# Patient Record
Sex: Male | Born: 1960
Health system: Southern US, Community
[De-identification: ages and names within clinical notes are randomized; demographics above are authoritative.]

## PROBLEM LIST (undated history)

## (undated) DIAGNOSIS — Z8719 Personal history of other diseases of the digestive system: Secondary | ICD-10-CM

## (undated) DIAGNOSIS — Z21 Asymptomatic human immunodeficiency virus [HIV] infection status: Secondary | ICD-10-CM

## (undated) DIAGNOSIS — J449 Chronic obstructive pulmonary disease, unspecified: Secondary | ICD-10-CM

## (undated) DIAGNOSIS — B20 Human immunodeficiency virus [HIV] disease: Secondary | ICD-10-CM

## (undated) HISTORY — DX: Asymptomatic human immunodeficiency virus (hiv) infection status: Z21

## (undated) HISTORY — DX: Human immunodeficiency virus (HIV) disease: B20

---

## 2001-02-04 ENCOUNTER — Emergency Department (HOSPITAL_COMMUNITY): Admission: EM | Admit: 2001-02-04 | Discharge: 2001-02-04 | Payer: Self-pay | Admitting: Emergency Medicine

## 2003-12-22 ENCOUNTER — Inpatient Hospital Stay (HOSPITAL_COMMUNITY): Admission: RE | Admit: 2003-12-22 | Discharge: 2003-12-25 | Payer: Self-pay | Admitting: Psychiatry

## 2004-03-23 ENCOUNTER — Emergency Department (HOSPITAL_COMMUNITY): Admission: EM | Admit: 2004-03-23 | Discharge: 2004-03-23 | Payer: Self-pay | Admitting: Emergency Medicine

## 2004-09-28 ENCOUNTER — Emergency Department (HOSPITAL_COMMUNITY): Admission: EM | Admit: 2004-09-28 | Discharge: 2004-09-28 | Payer: Self-pay | Admitting: Emergency Medicine

## 2005-05-23 ENCOUNTER — Emergency Department (HOSPITAL_COMMUNITY): Admission: EM | Admit: 2005-05-23 | Discharge: 2005-05-23 | Payer: Self-pay | Admitting: Emergency Medicine

## 2005-07-11 ENCOUNTER — Emergency Department (HOSPITAL_COMMUNITY): Admission: EM | Admit: 2005-07-11 | Discharge: 2005-07-11 | Payer: Self-pay | Admitting: Emergency Medicine

## 2006-09-12 ENCOUNTER — Encounter: Admission: RE | Admit: 2006-09-12 | Discharge: 2006-09-12 | Payer: Self-pay | Admitting: Internal Medicine

## 2006-09-12 ENCOUNTER — Ambulatory Visit: Payer: Self-pay | Admitting: Internal Medicine

## 2006-09-12 ENCOUNTER — Encounter (INDEPENDENT_AMBULATORY_CARE_PROVIDER_SITE_OTHER): Payer: Self-pay | Admitting: *Deleted

## 2006-09-12 LAB — CONVERTED CEMR LAB: CD4 T Cell Abs: 500

## 2006-09-20 ENCOUNTER — Encounter: Payer: Self-pay | Admitting: Internal Medicine

## 2006-09-20 DIAGNOSIS — K449 Diaphragmatic hernia without obstruction or gangrene: Secondary | ICD-10-CM | POA: Insufficient documentation

## 2006-09-20 DIAGNOSIS — G43909 Migraine, unspecified, not intractable, without status migrainosus: Secondary | ICD-10-CM | POA: Insufficient documentation

## 2006-09-20 DIAGNOSIS — R519 Headache, unspecified: Secondary | ICD-10-CM | POA: Insufficient documentation

## 2006-09-20 DIAGNOSIS — F329 Major depressive disorder, single episode, unspecified: Secondary | ICD-10-CM

## 2006-09-20 DIAGNOSIS — G4700 Insomnia, unspecified: Secondary | ICD-10-CM

## 2006-09-20 DIAGNOSIS — B59 Pneumocystosis: Secondary | ICD-10-CM

## 2006-09-20 DIAGNOSIS — B029 Zoster without complications: Secondary | ICD-10-CM | POA: Insufficient documentation

## 2006-09-20 DIAGNOSIS — N529 Male erectile dysfunction, unspecified: Secondary | ICD-10-CM

## 2006-09-20 DIAGNOSIS — K2980 Duodenitis without bleeding: Secondary | ICD-10-CM | POA: Insufficient documentation

## 2006-09-20 DIAGNOSIS — IMO0002 Reserved for concepts with insufficient information to code with codable children: Secondary | ICD-10-CM | POA: Insufficient documentation

## 2006-09-20 DIAGNOSIS — B20 Human immunodeficiency virus [HIV] disease: Secondary | ICD-10-CM

## 2006-09-20 DIAGNOSIS — R51 Headache: Secondary | ICD-10-CM

## 2006-09-20 DIAGNOSIS — F17200 Nicotine dependence, unspecified, uncomplicated: Secondary | ICD-10-CM | POA: Insufficient documentation

## 2006-09-20 DIAGNOSIS — A539 Syphilis, unspecified: Secondary | ICD-10-CM | POA: Insufficient documentation

## 2006-09-20 DIAGNOSIS — R197 Diarrhea, unspecified: Secondary | ICD-10-CM

## 2006-09-20 DIAGNOSIS — G609 Hereditary and idiopathic neuropathy, unspecified: Secondary | ICD-10-CM | POA: Insufficient documentation

## 2006-09-20 LAB — CONVERTED CEMR LAB
HCV Ab: NEGATIVE
Hep B S Ab: UNDETERMINED
Hepatitis B Surface Ag: NEGATIVE
RPR Titer: 1

## 2006-09-25 ENCOUNTER — Ambulatory Visit: Payer: Self-pay | Admitting: Internal Medicine

## 2006-09-25 DIAGNOSIS — F528 Other sexual dysfunction not due to a substance or known physiological condition: Secondary | ICD-10-CM

## 2006-10-01 ENCOUNTER — Encounter (INDEPENDENT_AMBULATORY_CARE_PROVIDER_SITE_OTHER): Payer: Self-pay | Admitting: *Deleted

## 2006-10-08 ENCOUNTER — Encounter (INDEPENDENT_AMBULATORY_CARE_PROVIDER_SITE_OTHER): Payer: Self-pay | Admitting: *Deleted

## 2006-10-16 ENCOUNTER — Telehealth: Payer: Self-pay | Admitting: Internal Medicine

## 2006-11-14 ENCOUNTER — Telehealth: Payer: Self-pay | Admitting: Internal Medicine

## 2006-11-28 ENCOUNTER — Encounter: Payer: Self-pay | Admitting: Internal Medicine

## 2006-11-29 ENCOUNTER — Ambulatory Visit: Payer: Self-pay | Admitting: Internal Medicine

## 2006-11-29 ENCOUNTER — Encounter: Admission: RE | Admit: 2006-11-29 | Discharge: 2006-11-29 | Payer: Self-pay | Admitting: Internal Medicine

## 2006-12-13 ENCOUNTER — Ambulatory Visit: Payer: Self-pay | Admitting: Internal Medicine

## 2007-02-18 ENCOUNTER — Encounter (INDEPENDENT_AMBULATORY_CARE_PROVIDER_SITE_OTHER): Payer: Self-pay | Admitting: *Deleted

## 2007-02-18 ENCOUNTER — Telehealth: Payer: Self-pay | Admitting: Internal Medicine

## 2007-02-25 ENCOUNTER — Encounter: Payer: Self-pay | Admitting: Internal Medicine

## 2007-02-25 ENCOUNTER — Ambulatory Visit: Payer: Self-pay | Admitting: Infectious Diseases

## 2007-02-25 ENCOUNTER — Encounter: Admission: RE | Admit: 2007-02-25 | Discharge: 2007-02-25 | Payer: Self-pay | Admitting: Infectious Diseases

## 2007-02-25 LAB — CONVERTED CEMR LAB
ALT: 23 units/L (ref 0–53)
AST: 37 units/L (ref 0–37)
Albumin: 4.3 g/dL (ref 3.5–5.2)
Alkaline Phosphatase: 75 units/L (ref 39–117)
BUN: 10 mg/dL (ref 6–23)
Basophils Absolute: 0 10*3/uL (ref 0.0–0.1)
Basophils Relative: 0 % (ref 0–1)
CO2: 27 meq/L (ref 19–32)
Calcium: 9.1 mg/dL (ref 8.4–10.5)
Chloride: 103 meq/L (ref 96–112)
Creatinine, Ser: 1.09 mg/dL (ref 0.40–1.50)
Eosinophils Absolute: 0.1 10*3/uL (ref 0.0–0.7)
Eosinophils Relative: 1 % (ref 0–5)
Glucose, Bld: 102 mg/dL — ABNORMAL HIGH (ref 70–99)
HCT: 43 % (ref 39.0–52.0)
HIV 1 RNA Quant: 150 copies/mL — ABNORMAL HIGH (ref <50)
HIV-1 RNA Quant, Log: 2.18 — ABNORMAL HIGH (ref <1.70)
Hemoglobin: 14.7 g/dL (ref 13.0–17.0)
Lymphocytes Relative: 26 % (ref 12–46)
Lymphs Abs: 2.1 10*3/uL (ref 0.7–3.3)
MCHC: 34.2 g/dL (ref 30.0–36.0)
MCV: 100.2 fL — ABNORMAL HIGH (ref 78.0–100.0)
Monocytes Absolute: 0.8 10*3/uL — ABNORMAL HIGH (ref 0.2–0.7)
Monocytes Relative: 10 % (ref 3–11)
Neutro Abs: 5.1 10*3/uL (ref 1.7–7.7)
Neutrophils Relative %: 63 % (ref 43–77)
Platelets: 255 10*3/uL (ref 150–400)
Potassium: 3.7 meq/L (ref 3.5–5.3)
RBC: 4.29 M/uL (ref 4.22–5.81)
RDW: 15.1 % — ABNORMAL HIGH (ref 11.5–14.0)
Sodium: 141 meq/L (ref 135–145)
Total Bilirubin: 0.7 mg/dL (ref 0.3–1.2)
Total Protein: 7.2 g/dL (ref 6.0–8.3)
WBC: 8.2 10*3/uL (ref 4.0–10.5)

## 2007-03-07 ENCOUNTER — Encounter (INDEPENDENT_AMBULATORY_CARE_PROVIDER_SITE_OTHER): Payer: Self-pay | Admitting: *Deleted

## 2007-03-12 ENCOUNTER — Ambulatory Visit: Payer: Self-pay | Admitting: Internal Medicine

## 2007-03-12 DIAGNOSIS — R350 Frequency of micturition: Secondary | ICD-10-CM

## 2007-03-12 LAB — CONVERTED CEMR LAB
Glucose, Urine, Semiquant: NEGATIVE
Ketones, urine, test strip: NEGATIVE
Nitrite: NEGATIVE
PSA: 0.94 ng/mL (ref 0.10–4.00)
Protein, U semiquant: 30
Specific Gravity, Urine: 1.025
Testosterone: 599.5 ng/dL (ref 350–890)
Urobilinogen, UA: 1
WBC Urine, dipstick: NEGATIVE
pH: 6

## 2007-03-13 ENCOUNTER — Encounter: Payer: Self-pay | Admitting: Internal Medicine

## 2007-03-14 ENCOUNTER — Telehealth: Payer: Self-pay | Admitting: Internal Medicine

## 2007-03-19 ENCOUNTER — Telehealth: Payer: Self-pay | Admitting: Internal Medicine

## 2007-04-01 ENCOUNTER — Telehealth: Payer: Self-pay | Admitting: Internal Medicine

## 2007-04-14 ENCOUNTER — Emergency Department (HOSPITAL_COMMUNITY): Admission: EM | Admit: 2007-04-14 | Discharge: 2007-04-14 | Payer: Self-pay | Admitting: Emergency Medicine

## 2007-05-19 ENCOUNTER — Encounter: Payer: Self-pay | Admitting: Internal Medicine

## 2007-05-19 ENCOUNTER — Emergency Department (HOSPITAL_COMMUNITY): Admission: EM | Admit: 2007-05-19 | Discharge: 2007-05-19 | Payer: Self-pay | Admitting: Emergency Medicine

## 2007-06-10 ENCOUNTER — Encounter: Admission: RE | Admit: 2007-06-10 | Discharge: 2007-06-10 | Payer: Self-pay | Admitting: Internal Medicine

## 2007-06-10 ENCOUNTER — Ambulatory Visit: Payer: Self-pay | Admitting: Internal Medicine

## 2007-06-10 LAB — CONVERTED CEMR LAB
ALT: 11 units/L (ref 0–53)
AST: 15 units/L (ref 0–37)
Albumin: 4.5 g/dL (ref 3.5–5.2)
Alkaline Phosphatase: 97 units/L (ref 39–117)
BUN: 13 mg/dL (ref 6–23)
Basophils Absolute: 0 10*3/uL (ref 0.0–0.1)
Basophils Relative: 0 % (ref 0–1)
CO2: 24 meq/L (ref 19–32)
Calcium: 9.5 mg/dL (ref 8.4–10.5)
Chloride: 108 meq/L (ref 96–112)
Creatinine, Ser: 1.08 mg/dL (ref 0.40–1.50)
Eosinophils Absolute: 0.1 10*3/uL — ABNORMAL LOW (ref 0.2–0.7)
Eosinophils Relative: 2 % (ref 0–5)
Glucose, Bld: 94 mg/dL (ref 70–99)
HCT: 43.7 % (ref 39.0–52.0)
HIV 1 RNA Quant: 93 copies/mL — ABNORMAL HIGH (ref <50)
HIV-1 RNA Quant, Log: 1.97 — ABNORMAL HIGH (ref <1.70)
Hemoglobin: 14.2 g/dL (ref 13.0–17.0)
Lymphocytes Relative: 26 % (ref 12–46)
Lymphs Abs: 1.8 10*3/uL (ref 0.7–4.0)
MCHC: 32.5 g/dL (ref 30.0–36.0)
MCV: 103.8 fL — ABNORMAL HIGH (ref 78.0–100.0)
Monocytes Absolute: 0.5 10*3/uL (ref 0.1–1.0)
Monocytes Relative: 8 % (ref 3–12)
Neutro Abs: 4.5 10*3/uL (ref 1.7–7.7)
Neutrophils Relative %: 65 % (ref 43–77)
Platelets: 249 10*3/uL (ref 150–400)
Potassium: 4.5 meq/L (ref 3.5–5.3)
RBC: 4.21 M/uL — ABNORMAL LOW (ref 4.22–5.81)
RDW: 14.7 % (ref 11.5–15.5)
Sodium: 143 meq/L (ref 135–145)
Total Bilirubin: 0.8 mg/dL (ref 0.3–1.2)
Total Protein: 7.6 g/dL (ref 6.0–8.3)
WBC: 7 10*3/uL (ref 4.0–10.5)

## 2007-06-25 ENCOUNTER — Ambulatory Visit: Payer: Self-pay | Admitting: Internal Medicine

## 2007-06-25 DIAGNOSIS — J984 Other disorders of lung: Secondary | ICD-10-CM

## 2007-07-15 ENCOUNTER — Encounter: Payer: Self-pay | Admitting: Internal Medicine

## 2007-08-07 ENCOUNTER — Telehealth: Payer: Self-pay | Admitting: Internal Medicine

## 2007-09-25 ENCOUNTER — Encounter: Admission: RE | Admit: 2007-09-25 | Discharge: 2007-09-25 | Payer: Self-pay | Admitting: Internal Medicine

## 2007-09-25 ENCOUNTER — Ambulatory Visit: Payer: Self-pay | Admitting: Internal Medicine

## 2007-09-25 LAB — CONVERTED CEMR LAB
ALT: 12 units/L (ref 0–53)
AST: 18 units/L (ref 0–37)
Albumin: 4.2 g/dL (ref 3.5–5.2)
Alkaline Phosphatase: 91 units/L (ref 39–117)
BUN: 12 mg/dL (ref 6–23)
Basophils Absolute: 0 10*3/uL (ref 0.0–0.1)
Basophils Relative: 0 % (ref 0–1)
CO2: 21 meq/L (ref 19–32)
Calcium: 8.8 mg/dL (ref 8.4–10.5)
Chloride: 108 meq/L (ref 96–112)
Creatinine, Ser: 0.85 mg/dL (ref 0.40–1.50)
Eosinophils Absolute: 0.1 10*3/uL (ref 0.0–0.7)
Eosinophils Relative: 2 % (ref 0–5)
Glucose, Bld: 99 mg/dL (ref 70–99)
HCT: 40.4 % (ref 39.0–52.0)
HIV 1 RNA Quant: <50 copies/mL (ref <50)
HIV-1 RNA Quant, Log: <1.7 (ref <1.70)
Hemoglobin: 13.6 g/dL (ref 13.0–17.0)
Lymphocytes Relative: 29 % (ref 12–46)
Lymphs Abs: 2.1 10*3/uL (ref 0.7–4.0)
MCHC: 33.7 g/dL (ref 30.0–36.0)
MCV: 100 fL (ref 78.0–100.0)
Monocytes Absolute: 0.6 10*3/uL (ref 0.1–1.0)
Monocytes Relative: 8 % (ref 3–12)
Neutro Abs: 4.5 10*3/uL (ref 1.7–7.7)
Neutrophils Relative %: 62 % (ref 43–77)
Platelets: 217 10*3/uL (ref 150–400)
Potassium: 4.2 meq/L (ref 3.5–5.3)
RBC: 4.04 M/uL — ABNORMAL LOW (ref 4.22–5.81)
RDW: 14.6 % (ref 11.5–15.5)
Sodium: 142 meq/L (ref 135–145)
Total Bilirubin: 0.5 mg/dL (ref 0.3–1.2)
Total Protein: 7.1 g/dL (ref 6.0–8.3)
WBC: 7.3 10*3/uL (ref 4.0–10.5)

## 2007-10-08 ENCOUNTER — Ambulatory Visit: Payer: Self-pay | Admitting: Internal Medicine

## 2007-10-27 ENCOUNTER — Emergency Department (HOSPITAL_COMMUNITY): Admission: EM | Admit: 2007-10-27 | Discharge: 2007-10-27 | Payer: Self-pay | Admitting: Emergency Medicine

## 2007-11-11 ENCOUNTER — Encounter (INDEPENDENT_AMBULATORY_CARE_PROVIDER_SITE_OTHER): Payer: Self-pay | Admitting: *Deleted

## 2008-01-01 ENCOUNTER — Encounter: Admission: RE | Admit: 2008-01-01 | Discharge: 2008-01-01 | Payer: Self-pay | Admitting: Internal Medicine

## 2008-01-01 ENCOUNTER — Ambulatory Visit: Payer: Self-pay | Admitting: Internal Medicine

## 2008-01-01 ENCOUNTER — Telehealth: Payer: Self-pay | Admitting: Internal Medicine

## 2008-01-01 LAB — CONVERTED CEMR LAB
ALT: 31 units/L (ref 0–53)
AST: 54 units/L — ABNORMAL HIGH (ref 0–37)
Albumin: 4.3 g/dL (ref 3.5–5.2)
Alkaline Phosphatase: 81 units/L (ref 39–117)
BUN: 10 mg/dL (ref 6–23)
Basophils Absolute: 0 10*3/uL (ref 0.0–0.1)
Basophils Relative: 0 % (ref 0–1)
CO2: 26 meq/L (ref 19–32)
Calcium: 9.2 mg/dL (ref 8.4–10.5)
Chloride: 104 meq/L (ref 96–112)
Creatinine, Ser: 0.85 mg/dL (ref 0.40–1.50)
Eosinophils Absolute: 0.1 10*3/uL (ref 0.0–0.7)
Eosinophils Relative: 1 % (ref 0–5)
Glucose, Bld: 97 mg/dL (ref 70–99)
HCT: 40.4 % (ref 39.0–52.0)
HIV 1 RNA Quant: 117 copies/mL — ABNORMAL HIGH (ref <50)
HIV-1 RNA Quant, Log: 2.07 — ABNORMAL HIGH (ref <1.70)
Hemoglobin: 13.7 g/dL (ref 13.0–17.0)
Lymphocytes Relative: 28 % (ref 12–46)
Lymphs Abs: 2.3 10*3/uL (ref 0.7–4.0)
MCHC: 33.9 g/dL (ref 30.0–36.0)
MCV: 99 fL (ref 78.0–100.0)
Monocytes Absolute: 0.6 10*3/uL (ref 0.1–1.0)
Monocytes Relative: 8 % (ref 3–12)
Neutro Abs: 5.1 10*3/uL (ref 1.7–7.7)
Neutrophils Relative %: 63 % (ref 43–77)
Platelets: 252 10*3/uL (ref 150–400)
Potassium: 3.5 meq/L (ref 3.5–5.3)
RBC: 4.08 M/uL — ABNORMAL LOW (ref 4.22–5.81)
RDW: 14.5 % (ref 11.5–15.5)
Sodium: 142 meq/L (ref 135–145)
Total Bilirubin: 0.9 mg/dL (ref 0.3–1.2)
Total Protein: 7.3 g/dL (ref 6.0–8.3)
WBC: 8 10*3/uL (ref 4.0–10.5)

## 2008-01-14 ENCOUNTER — Ambulatory Visit: Payer: Self-pay | Admitting: Internal Medicine

## 2008-01-28 ENCOUNTER — Encounter: Payer: Self-pay | Admitting: Internal Medicine

## 2008-02-13 ENCOUNTER — Telehealth: Payer: Self-pay | Admitting: Internal Medicine

## 2008-04-15 ENCOUNTER — Ambulatory Visit: Payer: Self-pay | Admitting: Internal Medicine

## 2008-04-15 LAB — CONVERTED CEMR LAB
ALT: 15 units/L (ref 0–53)
AST: 19 units/L (ref 0–37)
Albumin: 4.5 g/dL (ref 3.5–5.2)
Alkaline Phosphatase: 93 units/L (ref 39–117)
BUN: 12 mg/dL (ref 6–23)
Basophils Absolute: 0 10*3/uL (ref 0.0–0.1)
Basophils Relative: 0 % (ref 0–1)
CO2: 22 meq/L (ref 19–32)
Calcium: 9.7 mg/dL (ref 8.4–10.5)
Chloride: 105 meq/L (ref 96–112)
Creatinine, Ser: 0.96 mg/dL (ref 0.40–1.50)
Eosinophils Absolute: 0.1 10*3/uL (ref 0.0–0.7)
Eosinophils Relative: 1 % (ref 0–5)
Glucose, Bld: 98 mg/dL (ref 70–99)
HCT: 43 % (ref 39.0–52.0)
HIV 1 RNA Quant: 270 copies/mL — ABNORMAL HIGH (ref <50)
HIV-1 RNA Quant, Log: 2.43 — ABNORMAL HIGH (ref <1.70)
Hemoglobin: 14.4 g/dL (ref 13.0–17.0)
Lymphocytes Relative: 27 % (ref 12–46)
Lymphs Abs: 2.1 10*3/uL (ref 0.7–4.0)
MCHC: 33.5 g/dL (ref 30.0–36.0)
MCV: 100.9 fL — ABNORMAL HIGH (ref 78.0–100.0)
Monocytes Absolute: 0.6 10*3/uL (ref 0.1–1.0)
Monocytes Relative: 8 % (ref 3–12)
Neutro Abs: 4.8 10*3/uL (ref 1.7–7.7)
Neutrophils Relative %: 63 % (ref 43–77)
Platelets: 251 10*3/uL (ref 150–400)
Potassium: 4.2 meq/L (ref 3.5–5.3)
RBC: 4.26 M/uL (ref 4.22–5.81)
RDW: 14.6 % (ref 11.5–15.5)
Sodium: 140 meq/L (ref 135–145)
Total Bilirubin: 0.6 mg/dL (ref 0.3–1.2)
Total Protein: 7.7 g/dL (ref 6.0–8.3)
WBC: 7.6 10*3/uL (ref 4.0–10.5)

## 2008-04-19 ENCOUNTER — Encounter: Payer: Self-pay | Admitting: Internal Medicine

## 2008-04-30 ENCOUNTER — Ambulatory Visit: Payer: Self-pay | Admitting: Internal Medicine

## 2008-04-30 ENCOUNTER — Ambulatory Visit (HOSPITAL_COMMUNITY): Admission: RE | Admit: 2008-04-30 | Discharge: 2008-04-30 | Payer: Self-pay | Admitting: Internal Medicine

## 2008-05-19 ENCOUNTER — Encounter: Payer: Self-pay | Admitting: Internal Medicine

## 2008-05-20 ENCOUNTER — Encounter: Payer: Self-pay | Admitting: Internal Medicine

## 2008-05-20 ENCOUNTER — Encounter (INDEPENDENT_AMBULATORY_CARE_PROVIDER_SITE_OTHER): Payer: Self-pay | Admitting: *Deleted

## 2008-07-20 ENCOUNTER — Ambulatory Visit: Payer: Self-pay | Admitting: Internal Medicine

## 2008-07-20 LAB — CONVERTED CEMR LAB
ALT: 15 units/L (ref 0–53)
AST: 20 units/L (ref 0–37)
Albumin: 4.1 g/dL (ref 3.5–5.2)
Alkaline Phosphatase: 81 units/L (ref 39–117)
BUN: 10 mg/dL (ref 6–23)
Basophils Absolute: 0 10*3/uL (ref 0.0–0.1)
Basophils Relative: 1 % (ref 0–1)
CO2: 25 meq/L (ref 19–32)
Calcium: 8.9 mg/dL (ref 8.4–10.5)
Chloride: 105 meq/L (ref 96–112)
Creatinine, Ser: 0.99 mg/dL (ref 0.40–1.50)
Eosinophils Absolute: 0.1 10*3/uL (ref 0.0–0.7)
Eosinophils Relative: 1 % (ref 0–5)
Glucose, Bld: 95 mg/dL (ref 70–99)
HCT: 44.4 % (ref 39.0–52.0)
HIV 1 RNA Quant: 129 copies/mL — ABNORMAL HIGH (ref <48)
HIV-1 RNA Quant, Log: 2.11 — ABNORMAL HIGH (ref <1.68)
Hemoglobin: 14.8 g/dL (ref 13.0–17.0)
Lymphocytes Relative: 27 % (ref 12–46)
Lymphs Abs: 2.2 10*3/uL (ref 0.7–4.0)
MCHC: 33.3 g/dL (ref 30.0–36.0)
MCV: 100.7 fL — ABNORMAL HIGH (ref 78.0–100.0)
Monocytes Absolute: 0.6 10*3/uL (ref 0.1–1.0)
Monocytes Relative: 8 % (ref 3–12)
Neutro Abs: 5.1 10*3/uL (ref 1.7–7.7)
Neutrophils Relative %: 64 % (ref 43–77)
Platelets: 269 10*3/uL (ref 150–400)
Potassium: 4.1 meq/L (ref 3.5–5.3)
RBC: 4.41 M/uL (ref 4.22–5.81)
RDW: 14.8 % (ref 11.5–15.5)
Sodium: 143 meq/L (ref 135–145)
Total Bilirubin: 0.7 mg/dL (ref 0.3–1.2)
Total Protein: 7.5 g/dL (ref 6.0–8.3)
WBC: 8 10*3/uL (ref 4.0–10.5)

## 2008-07-27 ENCOUNTER — Emergency Department (HOSPITAL_COMMUNITY): Admission: EM | Admit: 2008-07-27 | Discharge: 2008-07-27 | Payer: Self-pay | Admitting: Family Medicine

## 2008-08-06 ENCOUNTER — Ambulatory Visit: Payer: Self-pay | Admitting: Internal Medicine

## 2008-08-20 ENCOUNTER — Ambulatory Visit: Payer: Self-pay | Admitting: Internal Medicine

## 2008-08-20 DIAGNOSIS — L299 Pruritus, unspecified: Secondary | ICD-10-CM | POA: Insufficient documentation

## 2008-08-27 ENCOUNTER — Telehealth (INDEPENDENT_AMBULATORY_CARE_PROVIDER_SITE_OTHER): Payer: Self-pay | Admitting: *Deleted

## 2008-09-01 ENCOUNTER — Ambulatory Visit: Payer: Self-pay | Admitting: Internal Medicine

## 2008-09-01 DIAGNOSIS — B370 Candidal stomatitis: Secondary | ICD-10-CM | POA: Insufficient documentation

## 2008-10-15 ENCOUNTER — Encounter: Payer: Self-pay | Admitting: Internal Medicine

## 2008-11-04 ENCOUNTER — Ambulatory Visit: Payer: Self-pay | Admitting: Internal Medicine

## 2008-11-04 LAB — CONVERTED CEMR LAB
ALT: 17 units/L (ref 0–53)
AST: 19 units/L (ref 0–37)
Albumin: 3.9 g/dL (ref 3.5–5.2)
Alkaline Phosphatase: 81 units/L (ref 39–117)
BUN: 10 mg/dL (ref 6–23)
Basophils Absolute: 0 10*3/uL (ref 0.0–0.1)
Basophils Relative: 0 % (ref 0–1)
CO2: 25 meq/L (ref 19–32)
Calcium: 8.8 mg/dL (ref 8.4–10.5)
Chloride: 107 meq/L (ref 96–112)
Cholesterol: 118 mg/dL (ref 0–200)
Creatinine, Ser: 0.95 mg/dL (ref 0.40–1.50)
Eosinophils Absolute: 0.1 10*3/uL (ref 0.0–0.7)
Eosinophils Relative: 2 % (ref 0–5)
GFR calc Af Amer: >60 mL/min (ref >60)
GFR calc non Af Amer: >60 mL/min (ref >60)
Glucose, Bld: 85 mg/dL (ref 70–99)
HCT: 38.8 % — ABNORMAL LOW (ref 39.0–52.0)
HDL: 37 mg/dL — ABNORMAL LOW (ref >39)
HIV 1 RNA Quant: <48 copies/mL (ref <48)
HIV-1 RNA Quant, Log: <1.68 (ref <1.68)
Hemoglobin: 13.3 g/dL (ref 13.0–17.0)
LDL Cholesterol: 68 mg/dL (ref 0–99)
Lymphocytes Relative: 28 % (ref 12–46)
Lymphs Abs: 2.1 10*3/uL (ref 0.7–4.0)
MCHC: 34.3 g/dL (ref 30.0–36.0)
MCV: 100 fL (ref 78.0–100.0)
Monocytes Absolute: 0.8 10*3/uL (ref 0.1–1.0)
Monocytes Relative: 10 % (ref 3–12)
Neutro Abs: 4.3 10*3/uL (ref 1.7–7.7)
Neutrophils Relative %: 59 % (ref 43–77)
Platelets: 248 10*3/uL (ref 150–400)
Potassium: 3.9 meq/L (ref 3.5–5.3)
RBC: 3.88 M/uL — ABNORMAL LOW (ref 4.22–5.81)
RDW: 14.6 % (ref 11.5–15.5)
Sodium: 141 meq/L (ref 135–145)
Total Bilirubin: 0.4 mg/dL (ref 0.3–1.2)
Total CHOL/HDL Ratio: 3.2
Total Protein: 7 g/dL (ref 6.0–8.3)
Triglycerides: 66 mg/dL (ref <150)
VLDL: 13 mg/dL (ref 0–40)
WBC: 7.3 10*3/uL (ref 4.0–10.5)

## 2008-11-19 ENCOUNTER — Ambulatory Visit: Payer: Self-pay | Admitting: Internal Medicine

## 2008-11-24 ENCOUNTER — Encounter (INDEPENDENT_AMBULATORY_CARE_PROVIDER_SITE_OTHER): Payer: Self-pay | Admitting: *Deleted

## 2008-12-06 ENCOUNTER — Encounter (INDEPENDENT_AMBULATORY_CARE_PROVIDER_SITE_OTHER): Payer: Self-pay | Admitting: *Deleted

## 2009-02-18 ENCOUNTER — Ambulatory Visit: Payer: Self-pay | Admitting: Internal Medicine

## 2009-02-18 LAB — CONVERTED CEMR LAB
ALT: 10 units/L (ref 0–53)
AST: 15 units/L (ref 0–37)
Albumin: 4.3 g/dL (ref 3.5–5.2)
Alkaline Phosphatase: 84 units/L (ref 39–117)
BUN: 13 mg/dL (ref 6–23)
Basophils Absolute: 0 10*3/uL (ref 0.0–0.1)
Basophils Relative: 0 % (ref 0–1)
CO2: 24 meq/L (ref 19–32)
Calcium: 9.5 mg/dL (ref 8.4–10.5)
Chloride: 107 meq/L (ref 96–112)
Creatinine, Ser: 0.97 mg/dL (ref 0.40–1.50)
Eosinophils Absolute: 0.1 10*3/uL (ref 0.0–0.7)
Eosinophils Relative: 1 % (ref 0–5)
Glucose, Bld: 82 mg/dL (ref 70–99)
HCT: 43.6 % (ref 39.0–52.0)
HIV 1 RNA Quant: 406 copies/mL — ABNORMAL HIGH (ref <48)
HIV-1 RNA Quant, Log: 2.61 — ABNORMAL HIGH (ref <1.68)
Hemoglobin: 14.4 g/dL (ref 13.0–17.0)
Lymphocytes Relative: 24 % (ref 12–46)
Lymphs Abs: 1.9 10*3/uL (ref 0.7–4.0)
MCHC: 33 g/dL (ref 30.0–36.0)
MCV: 103.3 fL — ABNORMAL HIGH (ref >=78.0)
Monocytes Absolute: 0.6 10*3/uL (ref 0.1–1.0)
Monocytes Relative: 8 % (ref 3–12)
Neutro Abs: 5.2 10*3/uL (ref 1.7–7.7)
Neutrophils Relative %: 66 % (ref 43–77)
Platelets: 222 10*3/uL (ref 150–400)
Potassium: 4.2 meq/L (ref 3.5–5.3)
RBC: 4.22 M/uL (ref 4.22–5.81)
RDW: 14.3 % (ref 11.5–15.5)
Sodium: 143 meq/L (ref 135–145)
Total Bilirubin: 0.4 mg/dL (ref 0.3–1.2)
Total Protein: 7.3 g/dL (ref 6.0–8.3)
WBC: 7.8 10*3/uL (ref 4.0–10.5)

## 2009-03-04 ENCOUNTER — Ambulatory Visit: Payer: Self-pay | Admitting: Internal Medicine

## 2009-06-07 ENCOUNTER — Ambulatory Visit: Payer: Self-pay | Admitting: Internal Medicine

## 2009-06-07 LAB — CONVERTED CEMR LAB
ALT: 15 U/L
AST: 15 U/L
Albumin: 4.5 g/dL
Alkaline Phosphatase: 97 U/L
BUN: 10 mg/dL
Basophils Absolute: 0 10*3/uL
Basophils Relative: 0 %
CO2: 22 meq/L
Calcium: 8.9 mg/dL
Chloride: 106 meq/L
Creatinine, Ser: 0.85 mg/dL
Eosinophils Absolute: 0.1 10*3/uL
Eosinophils Relative: 2 %
Glucose, Bld: 95 mg/dL
HCT: 40.3 %
HIV 1 RNA Quant: <48 {copies}/mL
HIV-1 RNA Quant, Log: <1.68
Hemoglobin: 13.4 g/dL
Lymphocytes Relative: 30 %
Lymphs Abs: 2.2 10*3/uL
MCHC: 33.3 g/dL
MCV: 100.5 fL — ABNORMAL HIGH
Monocytes Absolute: 0.6 10*3/uL
Monocytes Relative: 8 %
Neutro Abs: 4.4 10*3/uL
Neutrophils Relative %: 60 %
Platelets: 265 10*3/uL
Potassium: 4.2 meq/L
RBC: 4.01 M/uL — ABNORMAL LOW
RDW: 14.1 %
Sodium: 141 meq/L
Total Bilirubin: 0.6 mg/dL
Total Protein: 7.4 g/dL
WBC: 7.3 10*3/uL

## 2009-06-21 ENCOUNTER — Ambulatory Visit: Payer: Self-pay | Admitting: Internal Medicine

## 2009-09-19 ENCOUNTER — Ambulatory Visit: Payer: Self-pay | Admitting: Internal Medicine

## 2009-09-19 LAB — CONVERTED CEMR LAB
ALT: 12 units/L (ref 0–53)
AST: 15 units/L (ref 0–37)
Albumin: 4.8 g/dL (ref 3.5–5.2)
Alkaline Phosphatase: 120 units/L — ABNORMAL HIGH (ref 39–117)
BUN: 13 mg/dL (ref 6–23)
Basophils Absolute: 0 10*3/uL (ref 0.0–0.1)
Basophils Relative: 0 % (ref 0–1)
CO2: 25 meq/L (ref 19–32)
Calcium: 9.6 mg/dL (ref 8.4–10.5)
Chloride: 105 meq/L (ref 96–112)
Creatinine, Ser: 1 mg/dL (ref 0.40–1.50)
Eosinophils Absolute: 0.1 10*3/uL (ref 0.0–0.7)
Eosinophils Relative: 1 % (ref 0–5)
Glucose, Bld: 94 mg/dL (ref 70–99)
HCT: 42.7 % (ref 39.0–52.0)
HIV 1 RNA Quant: 213 copies/mL — ABNORMAL HIGH (ref <48)
HIV-1 RNA Quant, Log: 2.33 — ABNORMAL HIGH (ref <1.68)
Hemoglobin: 14.5 g/dL (ref 13.0–17.0)
Lymphocytes Relative: 27 % (ref 12–46)
Lymphs Abs: 2.4 10*3/uL (ref 0.7–4.0)
MCHC: 34 g/dL (ref 30.0–36.0)
MCV: 97.7 fL (ref >=78.0)
Monocytes Absolute: 0.8 10*3/uL (ref 0.1–1.0)
Monocytes Relative: 8 % (ref 3–12)
Neutro Abs: 5.8 10*3/uL (ref 1.7–7.7)
Neutrophils Relative %: 64 % (ref 43–77)
Platelets: 296 10*3/uL (ref 150–400)
Potassium: 4.2 meq/L (ref 3.5–5.3)
RBC: 4.37 M/uL (ref 4.22–5.81)
RDW: 14.7 % (ref 11.5–15.5)
Sodium: 140 meq/L (ref 135–145)
Total Bilirubin: 0.8 mg/dL (ref 0.3–1.2)
Total Protein: 8.1 g/dL (ref 6.0–8.3)
WBC: 9.1 10*3/uL (ref 4.0–10.5)

## 2009-09-27 ENCOUNTER — Encounter (INDEPENDENT_AMBULATORY_CARE_PROVIDER_SITE_OTHER): Payer: Self-pay | Admitting: *Deleted

## 2009-10-04 ENCOUNTER — Ambulatory Visit: Payer: Self-pay | Admitting: Internal Medicine

## 2009-11-07 ENCOUNTER — Telehealth: Payer: Self-pay | Admitting: Internal Medicine

## 2010-01-02 ENCOUNTER — Ambulatory Visit: Payer: Self-pay | Admitting: Internal Medicine

## 2010-01-02 LAB — CONVERTED CEMR LAB
ALT: 16 units/L (ref 0–53)
AST: 20 units/L (ref 0–37)
Albumin: 4.7 g/dL (ref 3.5–5.2)
Alkaline Phosphatase: 102 units/L (ref 39–117)
BUN: 12 mg/dL (ref 6–23)
Basophils Absolute: 0 10*3/uL (ref 0.0–0.1)
Basophils Relative: 0 % (ref 0–1)
CO2: 23 meq/L (ref 19–32)
Calcium: 9.5 mg/dL (ref 8.4–10.5)
Chloride: 104 meq/L (ref 96–112)
Creatinine, Ser: 1.01 mg/dL (ref 0.40–1.50)
Eosinophils Absolute: 0.1 10*3/uL (ref 0.0–0.7)
Eosinophils Relative: 1 % (ref 0–5)
Glucose, Bld: 84 mg/dL (ref 70–99)
HCT: 45.2 % (ref 39.0–52.0)
HIV 1 RNA Quant: <48 copies/mL (ref <48)
HIV-1 RNA Quant, Log: <1.68 (ref <1.68)
Hemoglobin: 15.2 g/dL (ref 13.0–17.0)
Lymphocytes Relative: 27 % (ref 12–46)
Lymphs Abs: 2 10*3/uL (ref 0.7–4.0)
MCHC: 33.6 g/dL (ref 30.0–36.0)
MCV: 100.2 fL — ABNORMAL HIGH (ref 78.0–100.0)
Monocytes Absolute: 0.7 10*3/uL (ref 0.1–1.0)
Monocytes Relative: 9 % (ref 3–12)
Neutro Abs: 4.9 10*3/uL (ref 1.7–7.7)
Neutrophils Relative %: 64 % (ref 43–77)
Platelets: 234 10*3/uL (ref 150–400)
Potassium: 4.2 meq/L (ref 3.5–5.3)
RBC: 4.51 M/uL (ref 4.22–5.81)
RDW: 14.1 % (ref 11.5–15.5)
Sodium: 137 meq/L (ref 135–145)
Total Bilirubin: 0.6 mg/dL (ref 0.3–1.2)
Total Protein: 8 g/dL (ref 6.0–8.3)
WBC: 7.7 10*3/uL (ref 4.0–10.5)

## 2010-01-18 ENCOUNTER — Ambulatory Visit: Payer: Self-pay | Admitting: Internal Medicine

## 2010-03-15 ENCOUNTER — Ambulatory Visit: Payer: Self-pay | Admitting: Internal Medicine

## 2010-03-15 LAB — CONVERTED CEMR LAB
ALT: 16 units/L (ref 0–53)
AST: 15 units/L (ref 0–37)
Albumin: 4.2 g/dL (ref 3.5–5.2)
Alkaline Phosphatase: 89 units/L (ref 39–117)
BUN: 10 mg/dL (ref 6–23)
Basophils Absolute: 0 10*3/uL (ref 0.0–0.1)
Basophils Relative: 0 % (ref 0–1)
CO2: 25 meq/L (ref 19–32)
Calcium: 9.2 mg/dL (ref 8.4–10.5)
Chloride: 107 meq/L (ref 96–112)
Cholesterol: 125 mg/dL (ref 0–200)
Creatinine, Ser: 0.98 mg/dL (ref 0.40–1.50)
Eosinophils Absolute: 0.1 10*3/uL (ref 0.0–0.7)
Eosinophils Relative: 1 % (ref 0–5)
Glucose, Bld: 105 mg/dL — ABNORMAL HIGH (ref 70–99)
HCT: 41.4 % (ref 39.0–52.0)
HDL: 45 mg/dL (ref >39)
HIV-1 RNA Quant, Log: <1.68 (ref <1.68)
Hemoglobin: 14 g/dL (ref 13.0–17.0)
LDL Cholesterol: 57 mg/dL (ref 0–99)
Lymphocytes Relative: 28 % (ref 12–46)
Lymphs Abs: 1.9 10*3/uL (ref 0.7–4.0)
MCHC: 33.8 g/dL (ref 30.0–36.0)
MCV: 99.3 fL (ref 78.0–100.0)
Monocytes Absolute: 0.5 10*3/uL (ref 0.1–1.0)
Monocytes Relative: 8 % (ref 3–12)
Neutro Abs: 4.2 10*3/uL (ref 1.7–7.7)
Neutrophils Relative %: 63 % (ref 43–77)
Platelets: 260 10*3/uL (ref 150–400)
Potassium: 3.7 meq/L (ref 3.5–5.3)
RBC: 4.17 M/uL — ABNORMAL LOW (ref 4.22–5.81)
RDW: 15.5 % (ref 11.5–15.5)
Sodium: 141 meq/L (ref 135–145)
Total Bilirubin: 0.8 mg/dL (ref 0.3–1.2)
Total CHOL/HDL Ratio: 2.8
Total Protein: 7.2 g/dL (ref 6.0–8.3)
Triglycerides: 115 mg/dL (ref <150)
VLDL: 23 mg/dL (ref 0–40)
WBC: 6.7 10*3/uL (ref 4.0–10.5)

## 2010-04-05 ENCOUNTER — Ambulatory Visit: Payer: Self-pay | Admitting: Internal Medicine

## 2010-04-06 ENCOUNTER — Telehealth (INDEPENDENT_AMBULATORY_CARE_PROVIDER_SITE_OTHER): Payer: Self-pay | Admitting: *Deleted

## 2010-04-14 ENCOUNTER — Encounter: Payer: Self-pay | Admitting: Internal Medicine

## 2010-07-31 ENCOUNTER — Encounter: Payer: Self-pay | Admitting: Internal Medicine

## 2010-07-31 ENCOUNTER — Ambulatory Visit
Admission: RE | Admit: 2010-07-31 | Discharge: 2010-07-31 | Payer: Self-pay | Source: Home / Self Care | Attending: Internal Medicine | Admitting: Internal Medicine

## 2010-07-31 LAB — CONVERTED CEMR LAB
ALT: 25 units/L (ref 0–53)
Albumin: 4.7 g/dL (ref 3.5–5.2)
CO2: 25 meq/L (ref 19–32)
Calcium: 9.6 mg/dL (ref 8.4–10.5)
Chloride: 105 meq/L (ref 96–112)
Eosinophils Absolute: 0.1 10*3/uL (ref 0.0–0.7)
Glucose, Bld: 106 mg/dL — ABNORMAL HIGH (ref 70–99)
HIV 1 RNA Quant: 23 copies/mL — ABNORMAL HIGH (ref <20)
HIV-1 RNA Quant, Log: 1.36 — ABNORMAL HIGH (ref <1.30)
Lymphocytes Relative: 26 % (ref 12–46)
Lymphs Abs: 1.7 10*3/uL (ref 0.7–4.0)
Monocytes Relative: 9 % (ref 3–12)
Neutro Abs: 4.1 10*3/uL (ref 1.7–7.7)
Neutrophils Relative %: 63 % (ref 43–77)
Platelets: 317 10*3/uL (ref 150–400)
RBC: 4.26 M/uL (ref 4.22–5.81)
Sodium: 141 meq/L (ref 135–145)
Total Bilirubin: 0.7 mg/dL (ref 0.3–1.2)
Total Protein: 7.9 g/dL (ref 6.0–8.3)
WBC: 6.6 10*3/uL (ref 4.0–10.5)

## 2010-08-07 LAB — T-HELPER CELL (CD4) - (RCID CLINIC ONLY)
CD4 % Helper T Cell: 29 % — ABNORMAL LOW (ref 33–55)
CD4 T Cell Abs: 500 uL (ref 400–2700)

## 2010-08-14 ENCOUNTER — Ambulatory Visit
Admission: RE | Admit: 2010-08-14 | Discharge: 2010-08-14 | Payer: Self-pay | Source: Home / Self Care | Attending: Internal Medicine | Admitting: Internal Medicine

## 2010-08-14 DIAGNOSIS — J449 Chronic obstructive pulmonary disease, unspecified: Secondary | ICD-10-CM | POA: Insufficient documentation

## 2010-08-14 DIAGNOSIS — J4489 Other specified chronic obstructive pulmonary disease: Secondary | ICD-10-CM | POA: Insufficient documentation

## 2010-08-14 DIAGNOSIS — M1990 Unspecified osteoarthritis, unspecified site: Secondary | ICD-10-CM | POA: Insufficient documentation

## 2010-08-14 DIAGNOSIS — M4150 Other secondary scoliosis, site unspecified: Secondary | ICD-10-CM | POA: Insufficient documentation

## 2010-08-20 LAB — CONVERTED CEMR LAB
ALT: 14 units/L (ref 0–53)
ALT: 22 units/L (ref 0–53)
AST: 20 units/L (ref 0–37)
AST: 47 units/L — ABNORMAL HIGH (ref 0–37)
Albumin: 4.4 g/dL (ref 3.5–5.2)
Albumin: 4.6 g/dL (ref 3.5–5.2)
Alkaline Phosphatase: 83 units/L (ref 39–117)
Alkaline Phosphatase: 87 units/L (ref 39–117)
BUN: 11 mg/dL (ref 6–23)
BUN: 12 mg/dL (ref 6–23)
Basophils Absolute: 0 10*3/uL (ref 0.0–0.1)
Basophils Absolute: 0 10*3/uL (ref 0.0–0.1)
Basophils Relative: 0 % (ref 0–1)
Basophils Relative: 0 % (ref 0–1)
CD4 Count: 500 microliters
CD4 Count: 560 microliters
CO2: 27 meq/L (ref 19–32)
CO2: 28 meq/L (ref 19–32)
Calcium: 9.4 mg/dL (ref 8.4–10.5)
Calcium: 9.4 mg/dL (ref 8.4–10.5)
Chlamydia, Swab/Urine, PCR: NEGATIVE
Chloride: 104 meq/L (ref 96–112)
Chloride: 106 meq/L (ref 96–112)
Cholesterol: 128 mg/dL (ref 0–200)
Creatinine, Ser: 0.92 mg/dL (ref 0.40–1.50)
Creatinine, Ser: 0.94 mg/dL (ref 0.40–1.50)
Eosinophils Absolute: 0.1 10*3/uL (ref 0.0–0.7)
Eosinophils Absolute: 0.1 10*3/uL (ref 0.0–0.7)
Eosinophils Relative: 1 % (ref 0–5)
Eosinophils Relative: 2 % (ref 0–5)
GC Probe Amp, Urine: NEGATIVE
Glucose, Bld: 86 mg/dL (ref 70–99)
Glucose, Bld: 90 mg/dL (ref 70–99)
HCT: 44.2 % (ref 39.0–52.0)
HCT: 45.9 % (ref 39.0–52.0)
HCV Ab: NEGATIVE
HDL: 41 mg/dL (ref >39)
HIV 1 RNA Quant: 281 copies/mL — ABNORMAL HIGH (ref <50)
HIV 1 RNA Quant: <50 copies/mL (ref <50)
HIV-1 RNA Quant, Log: 2.45 — ABNORMAL HIGH (ref <1.70)
HIV-1 RNA Quant, Log: <1.7 (ref <1.70)
HIV-1 antibody: POSITIVE — AB
HIV-2 Ab: NEGATIVE
HIV: REACTIVE
Hemoglobin, Urine: NEGATIVE
Hemoglobin: 14.9 g/dL (ref 13.0–17.0)
Hemoglobin: 15.6 g/dL (ref 13.0–17.0)
Hep B Core Total Ab: NEGATIVE
Hepatitis B Surface Ag: NEGATIVE
LDL Cholesterol: 65 mg/dL (ref 0–99)
Lymphocytes Relative: 20 % (ref 12–46)
Lymphocytes Relative: 30 % (ref 12–46)
Lymphs Abs: 2 10*3/uL (ref 0.7–3.3)
Lymphs Abs: 2.2 10*3/uL (ref 0.7–3.3)
MCHC: 33.7 g/dL (ref 30.0–36.0)
MCHC: 34 g/dL (ref 30.0–36.0)
MCV: 101.4 fL — ABNORMAL HIGH (ref 78.0–100.0)
MCV: 101.8 fL — ABNORMAL HIGH (ref 78.0–100.0)
Monocytes Absolute: 0.6 10*3/uL (ref 0.2–0.7)
Monocytes Absolute: 0.7 10*3/uL (ref 0.2–0.7)
Monocytes Relative: 7 % (ref 3–11)
Monocytes Relative: 8 % (ref 3–11)
Neutro Abs: 4.4 10*3/uL (ref 1.7–7.7)
Neutro Abs: 6.8 10*3/uL (ref 1.7–7.7)
Neutrophils Relative %: 60 % (ref 43–77)
Neutrophils Relative %: 71 % (ref 43–77)
Nitrite: NEGATIVE
Platelets: 230 10*3/uL (ref 150–400)
Platelets: 242 10*3/uL (ref 150–400)
Potassium: 3.9 meq/L (ref 3.5–5.3)
Potassium: 4.2 meq/L (ref 3.5–5.3)
Protein, ur: NEGATIVE mg/dL
RBC / HPF: NONE SEEN (ref <3)
RBC: 4.36 M/uL (ref 4.22–5.81)
RBC: 4.51 M/uL (ref 4.22–5.81)
RDW: 14 % (ref 11.5–14.0)
RDW: 14.5 % — ABNORMAL HIGH (ref 11.5–14.0)
RPR Ser Ql: REACTIVE — AB
RPR Titer: 1:1 {titer}
Sodium: 139 meq/L (ref 135–145)
Sodium: 140 meq/L (ref 135–145)
Specific Gravity, Urine: 1.031 (ref 1.005–1.03)
Total Bilirubin: 0.6 mg/dL (ref 0.3–1.2)
Total Bilirubin: 0.9 mg/dL (ref 0.3–1.2)
Total CHOL/HDL Ratio: 3.1
Total Protein: 7.6 g/dL (ref 6.0–8.3)
Total Protein: 7.7 g/dL (ref 6.0–8.3)
Triglycerides: 111 mg/dL (ref <150)
Urine Glucose: NEGATIVE mg/dL
Urobilinogen, UA: 1 (ref 0.0–1.0)
VLDL: 22 mg/dL (ref 0–40)
WBC, UA: NONE SEEN cells/hpf (ref <3)
WBC: 7.4 10*3/uL (ref 4.0–10.5)
WBC: 9.7 10*3/uL (ref 4.0–10.5)
pH: 6 (ref 5.0–8.0)

## 2010-08-22 NOTE — Assessment & Plan Note (Signed)
Summary: F/U [MKJ]   CC:  follow-up visit and lab results.  History of Present Illness: Pt doing well.  Here for lab results.  Depression History:      The patient denies a depressed mood most of the day and a diminished interest in his usual daily activities.        The patient denies that he feels like life is not worth living, denies that he wishes that he were dead, and denies that he has thought about ending his life.        Preventive Screening-Counseling & Management  Alcohol-Tobacco     Alcohol drinks/day: <1     Alcohol type: beer, occasionally     Smoking Status: current     Smoking Cessation Counseling: yes     Packs/Day: 0.5     Year Started: 1970     Passive Smoke Exposure: no  Caffeine-Diet-Exercise     Caffeine use/day: 4     Does Patient Exercise: yes     Type of exercise: gym membership     Exercise (avg: min/session): >60     Times/week: 3  Hep-HIV-STD-Contraception     HIV Risk: no  Safety-Violence-Falls     Seat Belt Use: 100      Sexual History:  n/a.        Drug Use:  never.        Blood Transfusions:  no.        Travel History:  NONE.    Comments: pt. given condoms   Updated Prior Medication List: VIREAD 300 MG TABS (TENOFOVIR DISOPROXIL FUMARATE) 1po daily KALETRA 200-50 MG TABS (LOPINAVIR-RITONAVIR) take two by mouth twice daily with food TRIZIVIR 300-150-300 MG TABS (ABACAVIR-LAMIVUDINE-ZIDOVUDINE) take one tablet by mouth twice daily TRAZODONE HCL 100 MG TABS (TRAZODONE HCL) take one and 1/2 tablet at night CIALIS 20 MG TABS (TADALAFIL) take 1/2 to 1 tablet as needed MIDRIN 325-65-100 MG CAPS (APAP-ISOMETHEPTENE-DICHLORAL) prn IMITREX 50 MG TABS (SUMATRIPTAN SUCCINATE) as needed headache WELLBUTRIN XL 300 MG  TB24 (BUPROPION HCL) Take 1 tablet by mouth once a day CLARITIN 10 MG TABS (LORATADINE) Take 1 tablet by mouth once a day  Current Allergies (reviewed today): ! BACTRIM ! SULFA Past History:  Past Medical  History: Last updated: 09/25/2006 Depression Headache HIV disease  Review of Systems  The patient denies anorexia, fever, and weight loss.    Vital Signs:  Patient profile:   50 year old male Height:      77 inches (195.58 cm) Weight:      265.4 pounds (120.64 kg) BMI:     31.59 Temp:     98.1 degrees F (36.72 degrees C) oral Pulse rate:   65 / minute BP sitting:   157 / 89  (left arm)  Vitals Entered By: Wendall Mola CMA Duncan Dull) (April 05, 2010 2:26 PM) CC: follow-up visit, lab results Is Patient Diabetic? No Pain Assessment Patient in pain? no      Nutritional Status BMI of > 30 = obese Nutritional Status Detail appetite "good"  Does patient need assistance? Functional Status Self care Ambulation Normal Comments no missed doses of meds per patient   Physical Exam  General:  alert, well-developed, well-nourished, and well-hydrated.   Head:  normocephalic and atraumatic.   Lungs:  normal breath sounds.      Impression & Recommendations:  Problem # 1:  HIV DISEASE (ICD-042) Pt.s most recent CD4ct was 510 and VL <48 .  Pt instructed to continue  the current antiretroviral regimen.  Pt encouraged to take medication regularly and not miss doses.  Pt will f/u in 3 months for repeat blood work and will see me 2 weeks later.  Diagnostics Reviewed:  HIV: HIV positive - AIDS status unknown (10/04/2009)   HIV-Western blot: POSITIVE (09/12/2006)   CD4: 510 (03/16/2010)   WBC: 6.7 (03/15/2010)   Hgb: 14.0 (03/15/2010)   HCT: 41.4 (03/15/2010)   Platelets: 260 (03/15/2010) HIV-1 RNA: <48 copies/mL (03/15/2010)   HBSAg: Negative (09/20/2006)  Other Orders: Est. Patient Level III ([address]) Influenza Vaccine NON MCR (60454) Future Orders: T-CD4SP ([facility]) (CD4SP) ... 07/04/2010 T-HIV Viral Load [phone]) ... 07/04/2010 T-Comprehensive Metabolic Panel [id number]) ... 07/04/2010 T-CBC w/Diff ([id number]) ... 07/04/2010  Patient Instructions: 1)  Please  schedule a follow-up appointment in 3 months, 2 weeks after labs.  Prescriptions: WELLBUTRIN XL 300 MG  TB24 (BUPROPION HCL) Take 1 tablet by mouth once a day  #30 x 5   Entered and Authorized by:   Yisroel Ramming MD   Signed by:   Yisroel Ramming MD on 04/05/2010   Method used:   Print then Give to Patient   RxID:   [id number] CIALIS 20 MG TABS (TADALAFIL) take 1/2 to 1 tablet as needed  #2 x 5   Entered and Authorized by:   Yisroel Ramming MD   Signed by:   Yisroel Ramming MD on 04/05/2010   Method used:   Print then Give to Patient   RxID:   [id number] TRAZODONE HCL 100 MG TABS (TRAZODONE HCL) take one and 1/2 tablet at night  #135 x 5   Entered and Authorized by:   Yisroel Ramming MD   Signed by:   Yisroel Ramming MD on 04/05/2010   Method used:   Print then Give to Patient   RxID:   [id number]      Immunizations Administered:  Influenza Vaccine # 1:    Vaccine Type: Fluvax Non-MCR    Site: left deltoid    Mfr: [employer]    Dose: 0.5 ml    Route: IM    Given by: Wendall Mola CMA ( AAMA)    Exp. Date: 10/22/2010    Lot #: [id number]    VIS given: 02/14/10 version given April 05, 2010.  Flu Vaccine Consent Questions:    Do you have a history of severe allergic reactions to this vaccine? no    Any prior history of allergic reactions to egg and/or gelatin? no    Do you have a sensitivity to the preservative Thimersol? no    Do you have a past history of Guillan-Barre Syndrome? no    Do you currently have an acute febrile illness? no    Have you ever had a severe reaction to latex? no    Vaccine information given and explained to patient? yes

## 2010-08-22 NOTE — Assessment & Plan Note (Signed)
Summary: F/U/VS   CC:  follow-up visit.  History of Present Illness: Pt has been doing well. He would like a CPE next visit.  He also needs to see a eye doctor.  His vision has gotten blurry.  Preventive Screening-Counseling & Management  Alcohol-Tobacco     Alcohol drinks/day: 0     Alcohol type: beer, occasionally     Smoking Status: current     Smoking Cessation Counseling: yes     Packs/Day: 0.75     Year Started: 1969     Passive Smoke Exposure: no  Caffeine-Diet-Exercise     Caffeine use/day: 4     Does Patient Exercise: yes     Type of exercise: gym membership     Times/week: 3  Hep-HIV-STD-Contraception     HIV Risk: no  Safety-Violence-Falls     Seat Belt Use: 100   Updated Prior Medication List: VIREAD 300 MG TABS (TENOFOVIR DISOPROXIL FUMARATE) 1po daily KALETRA 200-50 MG TABS (LOPINAVIR-RITONAVIR) take two by mouth twice daily with food TRIZIVIR 300-150-300 MG TABS (ABACAVIR-LAMIVUDINE-ZIDOVUDINE) take one tablet by mouth twice daily TRAZODONE HCL 100 MG TABS (TRAZODONE HCL) take one and 1/2 tablet at night CIALIS 20 MG TABS (TADALAFIL) take 1/2 to 1 tablet as needed FLEXERIL 10 MG TABS (CYCLOBENZAPRINE HCL) one by mouth three times daily as needed for back pain ATIVAN 0.5 MG TABS (LORAZEPAM) take one or two every 6 hours as needed for anxiety MIDRIN 325-65-100 MG CAPS (APAP-ISOMETHEPTENE-DICHLORAL) prn IMITREX 50 MG TABS (SUMATRIPTAN SUCCINATE) as needed headache WELLBUTRIN XL 300 MG  TB24 (BUPROPION HCL) Take 1 tablet by mouth once a day CLARITIN 10 MG TABS (LORATADINE) Take 1 tablet by mouth once a day PERMETHRIN 5 % CREA (PERMETHRIN) apply - leave on for 8 hours then rinse off  Current Allergies (reviewed today): ! BACTRIM ! SULFA Past History:  Past Medical History: Last updated: 09/25/2006 Depression Headache HIV disease  Review of Systems  The patient denies anorexia, fever, and weight loss.    Vital Signs:  Patient profile:   50  year old male Height:      77 inches (195.58 cm) Weight:      260.7 pounds (118.50 kg) BMI:     31.03 Temp:     97.5 degrees F (36.39 degrees C) oral Pulse rate:   64 / minute BP sitting:   137 / 84  (left arm)  Vitals Entered By: Wendall Mola CMA Duncan Dull) (October 04, 2009 3:02 PM) CC: follow-up visit Is Patient Diabetic? No Pain Assessment Patient in pain? no      Nutritional Status BMI of > 30 = obese Nutritional Status Detail appetite "good"  Does patient need assistance? Functional Status Self care Ambulation Normal Comments no missed doses of meds per patient   Physical Exam  General:  alert, well-developed, well-nourished, and well-hydrated.   Head:  normocephalic and atraumatic.   Mouth:  pharynx pink and moist.   Lungs:  normal breath sounds.      Impression & Recommendations:  Problem # 1:  HIV DISEASE (ICD-042) Pt.s most recent CD4ct was 760 and VL 213 .  Pt instructed to continue the current antiretroviral regimen.  Pt encouraged to take medication regularly and not miss doses.  Pt will f/u in 3 months for repeat blood work and will see me 2 weeks later.  Diagnostics Reviewed:  HIV: REACTIVE (09/12/2006)   HIV-Western blot: POSITIVE (09/12/2006)   CD4: 760 (09/20/2009)   WBC: 9.1 (09/19/2009)  Hgb: 14.5 (09/19/2009)   HCT: 42.7 (09/19/2009)   Platelets: 296 (09/19/2009) HIV-1 RNA: 213 (09/19/2009)   HBSAg: Negative (09/20/2006)  Problem # 2:  PREVENTIVE HEALTH CARE (ICD-V70.0) schedule CPE and eye exam Orders: Ophthalmology Referral (Ophthalmology)  Other Orders: Est. Patient Level III (66440) Future Orders: T-CD4SP ([facility]) (CD4SP) ... 01/02/2010 T-HIV Viral Load [phone]) ... 01/02/2010 T-Comprehensive Metabolic Panel [id number]) ... 01/02/2010 T-CBC w/Diff ([id number]) ... 01/02/2010 T-RPR (Syphilis) [id number]) ... 01/02/2010  Patient Instructions: 1)  Please schedule a follow-up appointment in 3 months, 2 weeks after  labs.  Prescriptions: WELLBUTRIN XL 300 MG  TB24 (BUPROPION HCL) Take 1 tablet by mouth once a day  #30 x 5   Entered and Authorized by:   Yisroel Ramming MD   Signed by:   Yisroel Ramming MD on 10/04/2009   Method used:   Print then Give to Patient   RxID:   [id number] CIALIS 20 MG TABS (TADALAFIL) take 1/2 to 1 tablet as needed  #2 x 5   Entered and Authorized by:   Yisroel Ramming MD   Signed by:   Yisroel Ramming MD on 10/04/2009   Method used:   Print then Give to Patient   RxID:   [id number] TRAZODONE HCL 100 MG TABS (TRAZODONE HCL) take one and 1/2 tablet at night  #45 x 5   Entered and Authorized by:   Yisroel Ramming MD   Signed by:   Yisroel Ramming MD on 10/04/2009   Method used:   Print then Give to Patient   RxID:   [id number]  Process Orders Check Orders Results:     [facility]: Check successful Tests Sent for requisitioning (October 04, 2009 3:17 PM):     01/02/2010: [facility] -- T-HIV Viral Load [phone] (signed)     01/02/2010: [facility] -- T-Comprehensive Metabolic Panel [[id number]] (signed)     01/02/2010: [facility] -- T-CBC w/Diff [[id number]] (signed)     01/02/2010: [facility] -- T-RPR (Syphilis) [id number] (signed)   Prevention & Chronic Care Immunizations   Influenza vaccine: Fluvax 3+  (06/21/2009)    Tetanus booster: 05/02/2006: Historical    Pneumococcal vaccine: Pneumovax  (06/25/2007)  Other Screening   PSA: 0.94  (03/12/2007)   Smoking status: current  (10/04/2009)   Smoking cessation counseling: yes  (10/04/2009)  Lipids   Total Cholesterol: 118  (11/04/2008)   LDL: 68  (11/04/2008)   LDL Direct: Not documented   HDL: 37  (11/04/2008)   Triglycerides: 66  (11/04/2008)

## 2010-08-22 NOTE — Miscellaneous (Signed)
Summary: Dr. Donette Larry  Dr. Donette Larry   Imported By: Florinda Marker 04/17/2010 15:55:42  _____________________________________________________________________  External Attachment:    Type:   Image     Comment:   External Document

## 2010-08-22 NOTE — Progress Notes (Signed)
Summary: Request for 3 months of med new pharmacy  Phone Note From Pharmacy   Caller: [facility] Details for Reason: need new script Summary of Call: Received a fax request from patient's new mail order pharmacy for his maitenance medication.  They are requesting a 3 month supply of Trazadone 100mg . Initial call taken by: Paulo Fruit  BS,CPht II,MPH,  November 07, 2009 2:20 PM  Follow-up for Phone Call        ok Follow-up by: Yisroel Ramming MD,  November 07, 2009 2:29 PM    Prescriptions: TRAZODONE HCL 100 MG TABS (TRAZODONE HCL) take one and 1/2 tablet at night  #135 x 3   Entered by:   Paulo Fruit  BS,CPht II,MPH   Authorized by:   Yisroel Ramming MD   Signed by:   Paulo Fruit  BS,CPht II,MPH on 11/07/2009   Method used:   Telephoned to ...       [facility] (mail-order)             , [place]         Ph: [phone]       Fax: [phone]   RxID:   [id number]  [facility] fax number for future faxes is [phone] Paulo Fruit  BS,CPht II,MPH  November 07, 2009 3:59 PM

## 2010-08-22 NOTE — Progress Notes (Signed)
Summary: labs faxed  Phone Note Outgoing Call   Call placed by: Annice Pih Summary of Call: Pts. labs faxed to PCP Dr. Clinton Sawyer at [phone] Initial call taken by: Wendall Mola CMA Duncan Dull),  April 06, 2010 8:56 AM

## 2010-08-22 NOTE — Assessment & Plan Note (Signed)
Summary: F/U/VS   CC:  follow-up visit.  History of Present Illness: Pt here for f/u.  He has been feeling well.  He would like a referral to a PCP preferably male for a full physical.  Preventive Screening-Counseling & Management  Alcohol-Tobacco     Alcohol drinks/day: <1     Alcohol type: beer, occasionally     Smoking Status: current     Smoking Cessation Counseling: yes     Packs/Day: 0.5     Year Started: 1969     Passive Smoke Exposure: no  Caffeine-Diet-Exercise     Caffeine use/day: 4     Does Patient Exercise: yes     Type of exercise: gym membership     Exercise (avg: min/session): >60     Times/week: 3  Hep-HIV-STD-Contraception     HIV Risk: no  Safety-Violence-Falls     Seat Belt Use: 100  Comments: given condoms      Sexual History:  n/a.        Drug Use:  never.     Updated Prior Medication List: VIREAD 300 MG TABS (TENOFOVIR DISOPROXIL FUMARATE) 1po daily KALETRA 200-50 MG TABS (LOPINAVIR-RITONAVIR) take two by mouth twice daily with food TRIZIVIR 300-150-300 MG TABS (ABACAVIR-LAMIVUDINE-ZIDOVUDINE) take one tablet by mouth twice daily TRAZODONE HCL 100 MG TABS (TRAZODONE HCL) take one and 1/2 tablet at night CIALIS 20 MG TABS (TADALAFIL) take 1/2 to 1 tablet as needed FLEXERIL 10 MG TABS (CYCLOBENZAPRINE HCL) one by mouth three times daily as needed for back pain ATIVAN 0.5 MG TABS (LORAZEPAM) take one or two every 6 hours as needed for anxiety MIDRIN 325-65-100 MG CAPS (APAP-ISOMETHEPTENE-DICHLORAL) prn IMITREX 50 MG TABS (SUMATRIPTAN SUCCINATE) as needed headache WELLBUTRIN XL 300 MG  TB24 (BUPROPION HCL) Take 1 tablet by mouth once a day CLARITIN 10 MG TABS (LORATADINE) Take 1 tablet by mouth once a day PERMETHRIN 5 % CREA (PERMETHRIN) apply - leave on for 8 hours then rinse off  Current Allergies (reviewed today): ! BACTRIM ! SULFA Past History:  Past Medical History: Last updated: 09/25/2006 Depression Headache HIV  disease  Social History: Sexual History:  n/a  Review of Systems  The patient denies anorexia, fever, and weight loss.    Vital Signs:  Patient profile:   50 year old male Height:      77 inches (195.58 cm) Weight:      259 pounds (117.73 kg) BMI:     30.82 Temp:     98.6 degrees F (37.00 degrees C) oral Pulse rate:   65 / minute BP sitting:   143 / 75  (left arm) Cuff size:   large  Vitals Entered By: Jennet Maduro RN (January 18, 2010 9:12 AM) CC: follow-up visit Is Patient Diabetic? No Pain Assessment Patient in pain? no      Nutritional Status BMI of 25 - 29 = overweight Nutritional Status Detail appetite "hungry"  Have you ever been in a relationship where you felt threatened, hurt or afraid?No   Does patient need assistance? Functional Status Self care Ambulation Normal Comments no missed doses of rxes   Physical Exam  General:  alert, well-developed, well-nourished, and well-hydrated.   Head:  normocephalic and atraumatic.   Mouth:  pharynx pink and moist.   Lungs:  normal breath sounds.      Impression & Recommendations:  Problem # 1:  HIV DISEASE (ICD-042) Pt.s most recent CD4ct was 590 and VL <48 .  Pt instructed to continue the  current antiretroviral regimen.  Pt encouraged to take medication regularly and not miss doses.  Pt will f/u in 3 months for repeat blood work and will see me 2 weeks later.  Diagnostics Reviewed:  HIV: HIV positive - AIDS status unknown (10/04/2009)   HIV-Western blot: POSITIVE (09/12/2006)   CD4: 590 (01/03/2010)   WBC: 7.7 (01/02/2010)   Hgb: 15.2 (01/02/2010)   HCT: 45.2 (01/02/2010)   Platelets: 234 (01/02/2010) HIV-1 RNA: <48 copies/mL (01/02/2010)   HBSAg: Negative (09/20/2006)  Problem # 2:  PREVENTIVE HEALTH CARE (ICD-V70.0) refer to IM to establish PCP. Orders: Internal Medicine Referral (Internal)  Other Orders: Est. Patient Level III (16109) Future Orders: T-CD4SP ([facility]) (CD4SP) ... 04/18/2010 T-HIV  Viral Load [phone]) ... 04/18/2010 T-Comprehensive Metabolic Panel [phone]) ... 04/18/2010 T-CBC w/Diff ([id number]) ... 04/18/2010 T-Lipid Profile [phone]) ... 04/18/2010  Patient Instructions: 1)  Please schedule a follow-up appointment in 3 months, 2 weeks after labs.  Prescriptions: WELLBUTRIN XL 300 MG  TB24 (BUPROPION HCL) Take 1 tablet by mouth once a day  #30 x 5   Entered and Authorized by:   Yisroel Ramming MD   Signed by:   Yisroel Ramming MD on 01/18/2010   Method used:   Print then Give to Patient   RxID:   [id number] CIALIS 20 MG TABS (TADALAFIL) take 1/2 to 1 tablet as needed  #2 x 5   Entered and Authorized by:   Yisroel Ramming MD   Signed by:   Yisroel Ramming MD on 01/18/2010   Method used:   Print then Give to Patient   RxID:   [id number] TRAZODONE HCL 100 MG TABS (TRAZODONE HCL) take one and 1/2 tablet at night  #135 x 3   Entered and Authorized by:   Yisroel Ramming MD   Signed by:   Yisroel Ramming MD on 01/18/2010   Method used:   Print then Give to Patient   RxID:   [id number]

## 2010-08-22 NOTE — Miscellaneous (Signed)
Summary: clinical update/ryan white [facility] appr til 10/21/10  Clinical Lists Changes  Observations: Added new observation of AIDSDAP: Yes 2011 (09/27/2009 12:24)

## 2010-08-30 NOTE — Assessment & Plan Note (Signed)
Summary: F/U [MKJ]   CC:  follow-up visit of labs recent Dx of COPD, Degenerative Disc Disease, and Back Pain.  History of Present Illness: dr volmer left before note finished   Preventive Screening-Counseling & Management  Alcohol-Tobacco     Alcohol drinks/day: <1     Alcohol type: beer, occasionally     Smoking Status: current     Smoking Cessation Counseling: yes     Packs/Day: 0.5     Year Started: 1969     Passive Smoke Exposure: no  Caffeine-Diet-Exercise     Caffeine use/day: 4     Does Patient Exercise: yes     Type of exercise: gym membership     Exercise (avg: min/session): >60     Times/week: 3   Current Allergies: ! BACTRIM ! SULFA Vital Signs:  Patient profile:   50 year old male Height:      77 inches Weight:      277 pounds BMI:     32.97 Temp:     98.2 degrees F oral Pulse rate:   71 / minute BP sitting:   163 / 94  (right arm)  Vitals Entered By: Alesia Morin CMA (August 14, 2010 3:51 PM) CC: follow-up visit of labs recent Dx of COPD, Degenerative Disc Disease, Back Pain Is Patient Diabetic? No Pain Assessment Patient in pain? no      Nutritional Status BMI of > 30 = obese Nutritional Status Detail appetite "good"  Have you ever been in a relationship where you felt threatened, hurt or afraid?No   Does patient need assistance? Functional Status Self care Ambulation Normal Comments no missed doses

## 2010-08-31 ENCOUNTER — Other Ambulatory Visit: Payer: Self-pay

## 2010-09-14 ENCOUNTER — Ambulatory Visit (INDEPENDENT_AMBULATORY_CARE_PROVIDER_SITE_OTHER): Payer: Medicare HMO | Admitting: Adult Health

## 2010-09-14 ENCOUNTER — Encounter: Payer: Self-pay | Admitting: Adult Health

## 2010-09-14 ENCOUNTER — Encounter (INDEPENDENT_AMBULATORY_CARE_PROVIDER_SITE_OTHER): Payer: Self-pay | Admitting: *Deleted

## 2010-09-14 DIAGNOSIS — B20 Human immunodeficiency virus [HIV] disease: Secondary | ICD-10-CM

## 2010-09-17 ENCOUNTER — Emergency Department (HOSPITAL_COMMUNITY)
Admission: EM | Admit: 2010-09-17 | Discharge: 2010-09-17 | Disposition: A | Discharge status: Home / Self Care | Payer: Medicare HMO | Attending: Emergency Medicine | Admitting: Emergency Medicine

## 2010-09-17 ENCOUNTER — Other Ambulatory Visit (HOSPITAL_COMMUNITY): Payer: Medicare HMO

## 2010-09-17 ENCOUNTER — Emergency Department (HOSPITAL_COMMUNITY): Payer: Medicare HMO

## 2010-09-17 DIAGNOSIS — Z21 Asymptomatic human immunodeficiency virus [HIV] infection status: Secondary | ICD-10-CM | POA: Insufficient documentation

## 2010-09-17 DIAGNOSIS — M25559 Pain in unspecified hip: Secondary | ICD-10-CM | POA: Insufficient documentation

## 2010-09-17 DIAGNOSIS — M545 Low back pain, unspecified: Secondary | ICD-10-CM | POA: Insufficient documentation

## 2010-09-17 DIAGNOSIS — M79609 Pain in unspecified limb: Secondary | ICD-10-CM | POA: Insufficient documentation

## 2010-09-18 ENCOUNTER — Other Ambulatory Visit: Payer: Self-pay | Admitting: Internal Medicine

## 2010-09-18 DIAGNOSIS — M545 Low back pain, unspecified: Secondary | ICD-10-CM

## 2010-09-19 NOTE — Miscellaneous (Signed)
Summary: adap update   Clinical Lists Changes  Observations: Added new observation of AIDSDAP: Pending-approval 2012 (09/14/2010 9:55) Added new observation of PCTFPL: 244.04  (09/14/2010 9:55) Added new observation of HOUSEINCOME: 16109  (09/14/2010 9:55) Added new observation of FINASSESSDT: 09/13/2010  (09/14/2010 9:55)

## 2010-09-19 NOTE — Assessment & Plan Note (Signed)
Summary: F/U   Vital Signs:  Patient profile:   50 year old male Height:      77 inches Weight:      274 pounds BMI:     32.61 Temp:     98.6 degrees F oral Pulse rate:   82 / minute BP sitting:   149 / 88  (right arm)  Vitals Entered By: Alesia Morin CMA (September 14, 2010 3:35 PM) CC: Pt in today to discuss a medication change Is Patient Diabetic? No Pain Assessment Patient in pain? no      Nutritional Status BMI of > 30 = obese Nutritional Status Detail appetite "Good"  Have you ever been in a relationship where you felt threatened, hurt or afraid?No   Does patient need assistance? Functional Status Self care Ambulation Normal Comments no missed doses   CC:  Pt in today to discuss a medication change.  History of Present Illness: Prersents to discuss possible treatment regimen switch.  Currently on Kaletra, Viread, Trizivir.  No data on record of possible resistance profiles.  States is interested in treatment simplification.  Preventive Screening-Counseling & Management  Alcohol-Tobacco     Alcohol drinks/day: <1     Alcohol type: beer, occasionally     Smoking Status: current     Smoking Cessation Counseling: yes     Packs/Day: 0.5     Year Started: 1970     Passive Smoke Exposure: no  Caffeine-Diet-Exercise     Caffeine use/day: 4     Does Patient Exercise: yes     Type of exercise: gym membership     Exercise (avg: min/session): >60     Times/week: 3  Hep-HIV-STD-Contraception     HIV Risk: no  Safety-Violence-Falls     Seat Belt Use: 100      Sexual History:  n/a.        Drug Use:  never.        Blood Transfusions:  no.        Travel History:  NONE.    Comments: pt declined condoms  Allergies: 1)  ! Bactrim 2)  ! Sulfa  Past History:  Past medical, surgical, family and social histories (including risk factors) reviewed for relevance to current acute and chronic problems.  Past Medical History: Reviewed history from  09/25/2006 and no changes required. Depression Headache HIV disease  Family History: Reviewed history from 09/25/2006 and no changes required. Family History Diabetes 1st degree relative Family History Hypertension  Social History: Reviewed history from 09/25/2006 and no changes required. Retired Current Smoker Alcohol use-yes  Review of Systems  The patient denies anorexia, fever, weight loss, weight gain, vision loss, decreased hearing, hoarseness, chest pain, syncope, dyspnea on exertion, peripheral edema, prolonged cough, headaches, hemoptysis, abdominal pain, melena, hematochezia, severe indigestion/heartburn, hematuria, incontinence, genital sores, muscle weakness, suspicious skin lesions, transient blindness, difficulty walking, depression, unusual weight change, abnormal bleeding, enlarged lymph nodes, angioedema, and testicular masses.     Impression & Recommendations:  Problem # 1:  HIV DISEASE (ICD-042) CD4 from 07/31/2010 500 @ 28% with VL of 23 copies/ml.  Plan to discontinue viread, Trizivir, Kaletra and beginning Truvada 200/300 1 by mouth once daily and Isentress 400mg  by mouth q12h.   Regimen outline, medications SE's, ADR's, and potential toxicities reviewed with him.  Questions answered.  Verbally acknowledged information and agreed with plan of care.  We will submit medication orders to [facility] and he is RTC in 5 weeks and f/u in 7 weeks. Orders:  Est. Patient Level III ([address]109) T-CBC w/Diff [phone]) T-CD4SP ([facility]) (CD4SP) T-Comprehensive Metabolic Panel [phone]) T-HIV Viral Load [phone])  Medications Added to Medication List This Visit: 1)  Truvada 200-300 Mg Tabs (Emtricitabine-tenofovir) .... Take one (1) tablet once a day 2)  Isentress 400 Mg Tabs (Raltegravir potassium) .... Take one (1) tablet every twelve (12) hours  Other Orders: T-Lipid Profile ([id number]) Prescriptions: ISENTRESS 400 MG TABS (RALTEGRAVIR POTASSIUM) Take one (1)  tablet every twelve (12) hours  #60 x 5   Entered by:   Alesia Morin CMA   Authorized by:   Talmadge Chad NP   Signed by:   Alesia Morin CMA on 09/14/2010   Method used:   Electronically to        [facility] [phone]* (retail)       [address]       [place], [place]  [address]       Ph: [phone]       Fax:    RxID:   [id number] TRUVADA 200-300 MG TABS (EMTRICITABINE-TENOFOVIR) Take one (1) tablet once a day  #30 x 5   Entered by:   Alesia Morin CMA   Authorized by:   Talmadge Chad NP   Signed by:   Alesia Morin CMA on 09/14/2010   Method used:   Electronically to        [facility] [phone]* (retail)       [address]       [place], [place]  [address]       Ph: [phone]       Fax:    RxID:   [id number]    Orders Added: 1)  Est. Patient Level III [76283] 2)  T-CBC w/Diff [[id number]] 3)  T-CD4SP ([facility]) [CD4SP] 4)  T-Comprehensive Metabolic Panel [[id number]] 5)  T-HIV Viral Load [[id number]] 6)  T-Lipid Profile [[id number]]

## 2010-09-20 ENCOUNTER — Ambulatory Visit
Admission: RE | Admit: 2010-09-20 | Discharge: 2010-09-20 | Disposition: A | Discharge status: Home / Self Care | Payer: Medicare HMO | Source: Ambulatory Visit | Attending: Internal Medicine | Admitting: Internal Medicine

## 2010-09-20 DIAGNOSIS — M545 Low back pain: Secondary | ICD-10-CM

## 2010-09-21 HISTORY — PX: LAMINECTOMY AND MICRODISCECTOMY LUMBAR SPINE: SHX1913

## 2010-09-26 ENCOUNTER — Encounter (INDEPENDENT_AMBULATORY_CARE_PROVIDER_SITE_OTHER): Payer: Self-pay | Admitting: *Deleted

## 2010-10-03 NOTE — Miscellaneous (Signed)
Summary: ADAP APPROVED 04/22/11   Clinical Lists Changes  Observations: Added new observation of AIDSDAP: Yes 2012 (09/26/2010 17:37)

## 2010-10-05 LAB — T-HELPER CELL (CD4) - (RCID CLINIC ONLY): CD4 T Cell Abs: 510 uL (ref 400–2700)

## 2010-10-06 ENCOUNTER — Telehealth: Payer: Self-pay

## 2010-10-10 NOTE — Progress Notes (Signed)
Summary: Request for medication refill   Phone Note Call from Patient   Summary of Call: Pt called to request trazadone script be sent to [phone].  I left message on machine. He must contact his pharmacy and have them send [place] the request.  Tomasita Morrow RN  October 06, 2010 10:46 AM  Initial call taken by: Tomasita Morrow RN,  October 06, 2010 10:47 AM

## 2010-10-12 ENCOUNTER — Inpatient Hospital Stay (HOSPITAL_COMMUNITY): Payer: Medicare HMO

## 2010-10-12 ENCOUNTER — Observation Stay (HOSPITAL_COMMUNITY)
Admission: RE | Admit: 2010-10-12 | Discharge: 2010-10-13 | Disposition: A | Discharge status: Home / Self Care | Payer: Medicare HMO | Source: Ambulatory Visit | Attending: Neurological Surgery | Admitting: Neurological Surgery

## 2010-10-12 ENCOUNTER — Observation Stay (HOSPITAL_COMMUNITY): Payer: Medicare HMO

## 2010-10-12 DIAGNOSIS — M5126 Other intervertebral disc displacement, lumbar region: Principal | ICD-10-CM | POA: Insufficient documentation

## 2010-10-12 LAB — CBC
Hemoglobin: 14.1 g/dL (ref 13.0–17.0)
MCH: 33 pg (ref 26.0–34.0)
MCV: 95.3 fL (ref 78.0–100.0)
RBC: 4.27 MIL/uL (ref 4.22–5.81)
WBC: 6.9 10*3/uL (ref 4.0–10.5)

## 2010-10-12 LAB — BASIC METABOLIC PANEL
CO2: 28 mEq/L (ref 19–32)
Calcium: 9.2 mg/dL (ref 8.4–10.5)
Chloride: 106 mEq/L (ref 96–112)
GFR calc Af Amer: >60 mL/min (ref >60)
Sodium: 140 mEq/L (ref 135–145)

## 2010-10-12 LAB — SURGICAL PCR SCREEN
MRSA, PCR: NEGATIVE
Staphylococcus aureus: NEGATIVE

## 2010-10-12 LAB — APTT: aPTT: 27 seconds (ref 24–37)

## 2010-10-13 NOTE — Op Note (Signed)
NAME:  Justin Joyce, Justin Joyce                ACCOUNT NO.:  [id number]  MEDICAL RECORD NO.:  [id number]           PATIENT TYPE:  O  LOCATION:  3535                         FACILITY:  [facility]  PHYSICIAN:  Tia Alert, MD     DATE OF BIRTH:  1961-03-23  DATE OF PROCEDURE:  10/12/2010 DATE OF DISCHARGE:                              OPERATIVE REPORT   PREOPERATIVE DIAGNOSIS:  Lumbar disk herniation L4-5 on the right with a large free fragment causing right L4 and L5 radiculopathies.  POSTOPERATIVE DIAGNOSIS:  Lumbar disk herniation L4-5 on the right with a large free fragment causing right L4 and L5 radiculopathies.  PROCEDURES:  Right lumbar hemilaminectomy, medial facetectomy with foraminotomy at L4-5 followed by microdiskectomy at L4-5 utilizing microscopic dissection to decompress with the L4 and the L5 nerve roots.  SURGEON:  Tia Alert, MD  ANESTHESIA:  General endotracheal.  COMPLICATIONS:  None apparent.  INDICATIONS FOR PROCEDURE:  Justin Joyce is a 50 year old gentleman who presented with severe right leg pain in both an L4-L5 distribution.  He had an MRI, which showed a large herniated disk at L4-5 on the right with a large free fragment extending superiorly compressing both the L4 and L5 nerve roots.  I recommended a microdiskectomy at L4-5 on the right in hopes of improving his pain syndrome.  He understood the risks, benefits, and expected outcome and wished to proceed.  DESCRIPTION OF PROCEDURE:  The patient was taken to the operating room and after induction of adequate generalized endotracheal anesthesia, he was rolled into a prone position on the Wilson frame.  When all pressure points were padded, his lumbar region was prepped with DuraPrep and draped in usual sterile fashion.  A small dorsal midline incision was made and carried down to the lumbosacral fascia.  The fascia was opened on the right side and taken down in a subperiosteal fashion to expose L4- 5 on the  right.  Intraoperative x-ray confirmed my level.  I then used the high-speed drill and Kerrison punches to perform a fairly generous laminotomy and medial facetectomy and foraminotomy at L4-5.  The underlying yellow ligament was opened and removed in a piecemeal fashion to expose the underlying dura at L4-L5 nerve roots.  I was able to find a large disk herniation with large free fragment to the lateral edge of the dura extending above the L4 nerve root.  I was able to remove 3 large fragments with a nerve hook and pituitary rongeur.  I was then able to incise the disk space to perform a thorough intradiskal diskectomy with pituitary rongeurs and curettes.  Once the diskectomy was complete, I palpated with a coronary dilator along the L4-L5 nerve roots along the midline.  I felt no more compression of the neural elements.  Everything looked good.  I irrigated with saline solution containing bacitracin, dried all bleeding points, lined the dura with Gelfoam, and closed the fascia with 0-Vicryl closing subcutaneous tissues with 2-0 Vicryl and the subcuticular tissue with 3-0 Vicryl.  The skin was closed with Benzoin and Steri-Strips.  The drapes removed.  Sterile dressing was applied.  The patient awakened from general anesthesia and transferred to the recovery room in stable condition.  At the end of the procedure, all sponge, needle, and instrument counts were correct.     Tia Alert, MD     DSJ/MEDQ  D:  10/12/2010  T:  10/13/2010  Job:  [id number]  Electronically Signed by Marikay Alar MD on 10/13/2010 11:57:58 AM

## 2010-10-19 ENCOUNTER — Other Ambulatory Visit: Payer: Medicare HMO

## 2010-10-25 LAB — T-HELPER CELL (CD4) - (RCID CLINIC ONLY): CD4 T Cell Abs: 550 uL (ref 400–2700)

## 2010-10-26 ENCOUNTER — Other Ambulatory Visit (INDEPENDENT_AMBULATORY_CARE_PROVIDER_SITE_OTHER): Payer: Medicare HMO

## 2010-10-26 DIAGNOSIS — Z79899 Other long term (current) drug therapy: Secondary | ICD-10-CM

## 2010-10-26 DIAGNOSIS — B20 Human immunodeficiency virus [HIV] disease: Secondary | ICD-10-CM

## 2010-10-27 LAB — CBC WITH DIFFERENTIAL/PLATELET
Basophils Absolute: 0 10*3/uL (ref 0.0–0.1)
Basophils Relative: 0 % (ref 0–1)
Eosinophils Absolute: 0.1 10*3/uL (ref 0.0–0.7)
Eosinophils Relative: 1 % (ref 0–5)
Lymphs Abs: 1.9 10*3/uL (ref 0.7–4.0)
MCH: 32.6 pg (ref 26.0–34.0)
MCV: 97 fL (ref 78.0–100.0)
Neutrophils Relative %: 70 % (ref 43–77)
Platelets: 239 10*3/uL (ref 150–400)
RBC: 4.39 MIL/uL (ref 4.22–5.81)
RDW: 13.9 % (ref 11.5–15.5)

## 2010-10-27 LAB — COMPLETE METABOLIC PANEL WITH GFR
ALT: 29 U/L (ref 0–53)
AST: 20 U/L (ref 0–37)
Alkaline Phosphatase: 89 U/L (ref 39–117)
CO2: 25 mEq/L (ref 19–32)
Creat: 1.09 mg/dL (ref 0.40–1.50)
GFR, Est African American: >60 mL/min (ref >60)
Sodium: 140 mEq/L (ref 135–145)
Total Bilirubin: 0.4 mg/dL (ref 0.3–1.2)
Total Protein: 7.6 g/dL (ref 6.0–8.3)

## 2010-10-27 LAB — LIPID PANEL
Cholesterol: 154 mg/dL (ref 0–200)
HDL: 54 mg/dL (ref >39)
Total CHOL/HDL Ratio: 2.9 Ratio
Triglycerides: 133 mg/dL (ref <150)
VLDL: 27 mg/dL (ref 0–40)

## 2010-10-27 LAB — T-HELPER CELL (CD4) - (RCID CLINIC ONLY): CD4 % Helper T Cell: 27 % — ABNORMAL LOW (ref 33–55)

## 2010-10-28 LAB — HIV-1 RNA QUANT-NO REFLEX-BLD: HIV-1 RNA Quant, Log: <1.3 {Log} (ref <1.30)

## 2010-11-01 LAB — T-HELPER CELL (CD4) - (RCID CLINIC ONLY)
CD4 % Helper T Cell: 24 % — ABNORMAL LOW (ref 33–55)
CD4 T Cell Abs: 460 uL (ref 400–2700)

## 2010-11-02 ENCOUNTER — Ambulatory Visit (INDEPENDENT_AMBULATORY_CARE_PROVIDER_SITE_OTHER): Payer: Medicare HMO | Admitting: Adult Health

## 2010-11-02 ENCOUNTER — Encounter: Payer: Self-pay | Admitting: Adult Health

## 2010-11-02 DIAGNOSIS — Z113 Encounter for screening for infections with a predominantly sexual mode of transmission: Secondary | ICD-10-CM

## 2010-11-02 DIAGNOSIS — B20 Human immunodeficiency virus [HIV] disease: Secondary | ICD-10-CM

## 2010-11-02 DIAGNOSIS — Z79899 Other long term (current) drug therapy: Secondary | ICD-10-CM

## 2010-11-02 NOTE — Progress Notes (Signed)
  Subjective:    Patient ID: Justin Joyce, male    DOB: Apr 28, 1961, 50 y.o.   MRN: [id number]  HPI Presents for f/u.  Had back surgery and since then has been pain free without motion limitation.  Adherent with meds with good tolerance.    Review of Systems  Constitutional: Negative.   HENT: Negative.   Eyes: Negative.   Respiratory: Negative.   Cardiovascular: Negative.   Gastrointestinal: Negative.   Genitourinary: Negative.   Musculoskeletal: Negative.   Skin: Negative.   Neurological: Negative.   Hematological: Negative.   Psychiatric/Behavioral: Negative.        Objective:   Physical Exam  Constitutional: He is oriented to person, place, and time. He appears well-developed and well-nourished. No distress.  HENT:  Head: Normocephalic and atraumatic.  Right Ear: External ear normal.  Left Ear: External ear normal.  Nose: Nose normal.  Mouth/Throat: Oropharynx is clear and moist.  Eyes: Conjunctivae and EOM are normal. Pupils are equal, round, and reactive to light. Right eye exhibits no discharge. Left eye exhibits no discharge. No scleral icterus.  Neck: Normal range of motion. Neck supple.  Cardiovascular: Normal rate, regular rhythm, normal heart sounds and intact distal pulses.   Pulmonary/Chest: Effort normal and breath sounds normal.  Abdominal: Soft. Bowel sounds are normal.  Musculoskeletal: Normal range of motion.  Neurological: He is alert and oriented to person, place, and time. He exhibits normal muscle tone. Coordination normal.  Skin: Skin is warm and dry. He is not diaphoretic.  Psychiatric: He has a normal mood and affect. His behavior is normal. Judgment and thought content normal.          Assessment & Plan:  HIV: CD4 520 @ 27% with VL <20 copies/ml.  Stable CPM.  Follow-up in 6 months.  Labs 2 weeks before next appointment.  Lower back pain with radiculopathy:  Abated since surgery.  Continue f/u with surgeon.

## 2010-12-08 NOTE — Discharge Summary (Signed)
NAME:  Justin Joyce, Justin Joyce                            ACCOUNT NO.:  [id number]   MEDICAL RECORD NO.:  [id number]                   PATIENT TYPE:  IPS   LOCATION:  0504                                 FACILITY:  [facility]   PHYSICIAN:  Jeanice Lim, M.D.              DATE OF BIRTH:  10/20/1959   DATE OF ADMISSION:  12/22/2003  DATE OF DISCHARGE:  12/25/2003                                 DISCHARGE SUMMARY   IDENTIFYING DATA:  This is a 50 year old single black male voluntarily  admitted, presenting with a history of intentional overdose, took a handful  of pills, uncertain of what they were.  Complains of depressive symptoms.  Wanting to go to sleep and not wake up.  Stressors include mother death in  2023/05/15, 46-year-old nephew died three months ago, wanted to die before his  mother.  Is followed up outpatient at [facility]  by Dr. Hortencia Joyce.  Had an overdose years ago.  [facility]  ad[facility].   MEDICAL PROBLEMS:  HIV (diagnosed in 1997).   MEDICATIONS:  Midrin for headaches, Imodium and HIV medications, Lexapro for  less than a year.   ALLERGIES:  BACTRIM (causes a rash).   PHYSICAL EXAMINATION:  Within normal limits.  Neurologically nonfocal.   LABORATORY DATA:  Routine admission labs within normal limits.   MENTAL STATUS EXAM:  Alert, middle-aged male.  Cooperative.  Poor eye  contact.  Speech soft-spoken.  Mood depressed.  Affect restricted.  Thought  processes goal directed.  No psychotic symptoms.  No acute dangerous  ideation.  Passive suicidal ideation.  Cognitively intact.  Judgment and  insight limited.   ADMISSION DIAGNOSES:   AXIS I:  Major depressive disorder, recurrent, moderate, versus depressive  disorder not otherwise specified.   AXIS II:  Deferred.   AXIS III:  Human immunodeficiency virus.   AXIS IV:  Moderate to severe (psychosocial stressors including family member  losses and medical problems).   AXIS V:   25/60.   HOSPITAL COURSE:  The patient was admitted and ordered routine p.r.n.  medications and underwent further monitoring.  Was encouraged to participate  in individual, group and milieu therapy.  Was placed on safety checks.  Was  recommended to participate in grief counseling, which was facilitated and  the patient participated in individual, group and milieu therapy and  aftercare planning.  She showed a positive response to clinical  interventions, tolerating medication changes without side effects and was  discharged in improved condition, suicidal ideation resolved, mood improved.   CONDITION ON DISCHARGE:  Improved.  Mood was more euthymic.  Affect  brighter.  Thought processes goal directed.  Thought content negative for  suicidal ideation and psychotic symptoms.  The patient reported motivation  to be compliant with aftercare plan including following up with grief  counseling due to the loss of his mother, who was his closest friend,  in  addition to medication management.  The patient was given medication  education.   DISCHARGE MEDICATIONS:  1. Lexapro 20 mg q.a.m.  2. Ambien 10 mg q.h.s. p.r.n.  3. Trazodone 50 mg, 1-2 at 9:30 p.m. for sleep.   FOLLOW UP:  The patient was to follow up with Justin Joyce _________ at [facility] to schedule counseling session and [facility] on Wednesday, December 29, 2003 at 11 a.m.   DISCHARGE DIAGNOSES:   AXIS I:  Major depressive disorder, recurrent, moderate, versus depressive  disorder not otherwise specified.   AXIS II:  Deferred.   AXIS III:  Human immunodeficiency virus.   AXIS IV:  Moderate to severe (psychosocial stressors including family member  losses and medical problems).   AXIS V:  Global Assessment of Functioning on discharge 55-60.                                               Jeanice Lim, M.D.    Justin Joyce  D:  01/11/2004  T:  01/12/2004  Job:  [id number]

## 2011-02-09 ENCOUNTER — Ambulatory Visit: Payer: Medicare HMO

## 2011-02-23 ENCOUNTER — Ambulatory Visit: Payer: Medicare HMO

## 2011-02-27 ENCOUNTER — Ambulatory Visit: Payer: Medicare HMO

## 2011-03-27 ENCOUNTER — Other Ambulatory Visit: Payer: Self-pay | Admitting: *Deleted

## 2011-03-27 DIAGNOSIS — B20 Human immunodeficiency virus [HIV] disease: Secondary | ICD-10-CM

## 2011-03-27 MED ORDER — EMTRICITABINE-TENOFOVIR DF 200-300 MG PO TABS: 1.0000 | ORAL_TABLET | Freq: Every day | ORAL | Status: DC

## 2011-03-27 MED ORDER — RALTEGRAVIR POTASSIUM 400 MG PO TABS: 400.0000 mg | ORAL_TABLET | Freq: Two times a day (BID) | ORAL | Status: DC

## 2011-04-17 ENCOUNTER — Other Ambulatory Visit (INDEPENDENT_AMBULATORY_CARE_PROVIDER_SITE_OTHER): Payer: Medicare HMO

## 2011-04-17 DIAGNOSIS — B20 Human immunodeficiency virus [HIV] disease: Secondary | ICD-10-CM

## 2011-04-17 DIAGNOSIS — Z79899 Other long term (current) drug therapy: Secondary | ICD-10-CM

## 2011-04-17 DIAGNOSIS — IMO0002 Reserved for concepts with insufficient information to code with codable children: Secondary | ICD-10-CM

## 2011-04-17 DIAGNOSIS — Z113 Encounter for screening for infections with a predominantly sexual mode of transmission: Secondary | ICD-10-CM

## 2011-04-18 LAB — LIPID PANEL
HDL: 43 mg/dL (ref >39)
Triglycerides: 67 mg/dL (ref <150)

## 2011-04-18 LAB — COMPREHENSIVE METABOLIC PANEL
CO2: 27 mEq/L (ref 19–32)
Calcium: 9 mg/dL (ref 8.4–10.5)
Creat: 1.01 mg/dL (ref 0.50–1.35)
Glucose, Bld: 80 mg/dL (ref 70–99)
Total Bilirubin: 0.6 mg/dL (ref 0.3–1.2)
Total Protein: 7.3 g/dL (ref 6.0–8.3)

## 2011-04-18 LAB — CBC WITH DIFFERENTIAL/PLATELET
Eosinophils Absolute: 0.1 10*3/uL (ref 0.0–0.7)
Eosinophils Relative: 1 % (ref 0–5)
HCT: 41.5 % (ref 39.0–52.0)
Hemoglobin: 14 g/dL (ref 13.0–17.0)
Lymphs Abs: 2.2 10*3/uL (ref 0.7–4.0)
MCH: 29.5 pg (ref 26.0–34.0)
MCV: 87.4 fL (ref 78.0–100.0)
Monocytes Relative: 8 % (ref 3–12)
RBC: 4.75 MIL/uL (ref 4.22–5.81)

## 2011-04-18 LAB — T-HELPER CELL (CD4) - (RCID CLINIC ONLY): CD4 % Helper T Cell: 29 % — ABNORMAL LOW (ref 33–55)

## 2011-04-18 LAB — RPR

## 2011-04-19 LAB — HIV-1 RNA QUANT-NO REFLEX-BLD
HIV 1 RNA Quant: <20 copies/mL (ref <20)
HIV-1 RNA Quant, Log: <1.3 {Log} (ref <1.30)

## 2011-04-19 LAB — T-HELPER CELL (CD4) - (RCID CLINIC ONLY)
CD4 % Helper T Cell: 22 — ABNORMAL LOW
CD4 T Cell Abs: 470

## 2011-04-27 LAB — T-HELPER CELL (CD4) - (RCID CLINIC ONLY): CD4 T Cell Abs: 490 uL (ref 400–2700)

## 2011-05-02 LAB — CBC
HCT: 39.6
Hemoglobin: 13.6
MCV: 101.7 — ABNORMAL HIGH
RBC: 3.89 — ABNORMAL LOW
WBC: 6.9

## 2011-05-02 LAB — POCT URINALYSIS DIP (DEVICE)
Bilirubin Urine: NEGATIVE
Glucose, UA: NEGATIVE
Ketones, ur: NEGATIVE
Operator id: 126491
Protein, ur: 30 — AB
Specific Gravity, Urine: 1.025

## 2011-05-02 LAB — HEPATIC FUNCTION PANEL
ALT: 14
AST: 16
Albumin: 3.8
Alkaline Phosphatase: 96
Bilirubin, Direct: 0.1
Indirect Bilirubin: 0.4
Total Bilirubin: 0.5
Total Protein: 7.4

## 2011-05-02 LAB — DIFFERENTIAL
Eosinophils Absolute: 0.1
Eosinophils Relative: 1
Lymphocytes Relative: 27
Lymphs Abs: 1.9
Monocytes Absolute: 0.7
Monocytes Relative: 10

## 2011-05-02 LAB — I-STAT 8, (EC8 V) (CONVERTED LAB)
BUN: 9
Bicarbonate: 25 — ABNORMAL HIGH
Chloride: 106
Glucose, Bld: 91
pCO2, Ven: 39.3 — ABNORMAL LOW

## 2011-05-02 LAB — POCT I-STAT CREATININE: Creatinine, Ser: 1.2

## 2011-05-02 LAB — D-DIMER, QUANTITATIVE: D-Dimer, Quant: 0.62 — ABNORMAL HIGH

## 2011-05-02 LAB — LIPASE, BLOOD: Lipase: 22

## 2011-05-03 LAB — URINALYSIS, ROUTINE W REFLEX MICROSCOPIC
Glucose, UA: NEGATIVE
Hgb urine dipstick: NEGATIVE
Protein, ur: NEGATIVE
Specific Gravity, Urine: 1.026
Urobilinogen, UA: 1

## 2011-05-07 LAB — T-HELPER CELL (CD4) - (RCID CLINIC ONLY)
CD4 % Helper T Cell: 21 — ABNORMAL LOW
CD4 T Cell Abs: 440

## 2011-05-09 ENCOUNTER — Encounter: Payer: Self-pay | Admitting: Infectious Diseases

## 2011-05-09 ENCOUNTER — Ambulatory Visit (INDEPENDENT_AMBULATORY_CARE_PROVIDER_SITE_OTHER): Payer: Medicare HMO | Admitting: Infectious Diseases

## 2011-05-09 DIAGNOSIS — M7050 Other bursitis of knee, unspecified knee: Secondary | ICD-10-CM | POA: Insufficient documentation

## 2011-05-09 DIAGNOSIS — IMO0002 Reserved for concepts with insufficient information to code with codable children: Secondary | ICD-10-CM

## 2011-05-09 DIAGNOSIS — B20 Human immunodeficiency virus [HIV] disease: Secondary | ICD-10-CM

## 2011-05-09 DIAGNOSIS — M76899 Other specified enthesopathies of unspecified lower limb, excluding foot: Secondary | ICD-10-CM

## 2011-05-09 NOTE — Assessment & Plan Note (Signed)
Left knee has mild protrusion of bursae with bending. It is not hot or tender to touch, there is no erythema. Will have him eval by ortho.

## 2011-05-09 NOTE — Assessment & Plan Note (Signed)
He has had laminectomy and significantly improved.

## 2011-05-09 NOTE — Progress Notes (Signed)
  Subjective:    Patient ID: Justin Joyce, male    DOB: 10-25-1960, 50 y.o.   MRN: [id number]  HPI 50 yo M with DDD, HIV+ 1997 (prev PCP). Was previously on KLT/TZV/TFV and switched to ISN/TRV February 2012. No probs with new meds. CD4 650 and VL <20 (04-17-11).  Had L5 laminectomy March 2012. Has athlete's "toes" and needs blood work so he can start on rx for this. Has noticed L knee tightness and bulging with bending.   Review of Systems  Constitutional: Negative for fever, chills and unexpected weight change.  Gastrointestinal: Negative for diarrhea and constipation.  Genitourinary: Negative for dysuria.       Objective:   Physical Exam  Constitutional: He appears well-developed and well-nourished.  Eyes: EOM are normal. Pupils are equal, round, and reactive to light.  Neck: Neck supple.  Cardiovascular: Normal rate, regular rhythm and normal heart sounds.   Pulmonary/Chest: Effort normal and breath sounds normal.  Abdominal: Soft. Bowel sounds are normal. He exhibits no distension.  Musculoskeletal:       Legs: Lymphadenopathy:    He has no cervical adenopathy.          Assessment & Plan:

## 2011-05-09 NOTE — Assessment & Plan Note (Signed)
He is doing very well. His pnvx and fluvax are uptodate. His CD4 and VL are good, not clear why he previously on a regimen which would indicate viral failure. Offered/given condoms. Will f/u with NP Sundra Aland in 6 months.

## 2011-08-07 ENCOUNTER — Other Ambulatory Visit: Payer: Self-pay | Admitting: *Deleted

## 2011-08-07 DIAGNOSIS — G4700 Insomnia, unspecified: Secondary | ICD-10-CM

## 2011-08-07 MED ORDER — TRAZODONE HCL 100 MG PO TABS: 150.0000 mg | ORAL_TABLET | Freq: Every day | ORAL | Status: DC

## 2011-08-13 ENCOUNTER — Telehealth: Payer: Self-pay | Admitting: *Deleted

## 2011-08-13 DIAGNOSIS — G4700 Insomnia, unspecified: Secondary | ICD-10-CM

## 2011-08-13 MED ORDER — TRAZODONE HCL 100 MG PO TABS: 150.0000 mg | ORAL_TABLET | Freq: Every day | ORAL | Status: DC

## 2011-08-13 NOTE — Telephone Encounter (Signed)
Pt has been out of the medication for several days and is feeling a difference.  Requesting local, short term rx until his mail order arrives.  Needed to be sent to [facility] not [facility].  Call to [facility].   [facility] is processing rx.  Will not be sent to pt for 14-21 days.  Will send short term rx to [facility] on [address].  Phone call to pt.  Needs to call [facility] to confirm home delivery address.  Pt given phone number.  Also let pt know that short term rx was called in to [facility] [address].

## 2011-10-02 ENCOUNTER — Ambulatory Visit: Payer: Medicare HMO

## 2011-10-08 ENCOUNTER — Other Ambulatory Visit: Payer: Self-pay | Admitting: Licensed Clinical Social Worker

## 2011-10-08 DIAGNOSIS — B20 Human immunodeficiency virus [HIV] disease: Secondary | ICD-10-CM

## 2011-11-05 ENCOUNTER — Other Ambulatory Visit (HOSPITAL_COMMUNITY)
Admission: RE | Admit: 2011-11-05 | Discharge: 2011-11-05 | Disposition: A | Discharge status: Home / Self Care | Payer: Medicare HMO | Source: Ambulatory Visit | Attending: Infectious Diseases | Admitting: Infectious Diseases

## 2011-11-05 ENCOUNTER — Other Ambulatory Visit: Payer: Medicare HMO

## 2011-11-05 DIAGNOSIS — Z113 Encounter for screening for infections with a predominantly sexual mode of transmission: Secondary | ICD-10-CM | POA: Insufficient documentation

## 2011-11-05 DIAGNOSIS — B20 Human immunodeficiency virus [HIV] disease: Secondary | ICD-10-CM

## 2011-11-05 LAB — LIPID PANEL
HDL: 44 mg/dL (ref >39)
LDL Cholesterol: 75 mg/dL (ref 0–99)
Total CHOL/HDL Ratio: 3.3 Ratio
VLDL: 26 mg/dL (ref 0–40)

## 2011-11-05 LAB — COMPREHENSIVE METABOLIC PANEL
ALT: 27 U/L (ref 0–53)
AST: 26 U/L (ref 0–37)
Alkaline Phosphatase: 75 U/L (ref 39–117)
BUN: 15 mg/dL (ref 6–23)
Calcium: 9.4 mg/dL (ref 8.4–10.5)
Creat: 1.04 mg/dL (ref 0.50–1.35)
Total Bilirubin: 0.5 mg/dL (ref 0.3–1.2)

## 2011-11-05 LAB — CBC
HCT: 43.5 % (ref 39.0–52.0)
MCH: 29.6 pg (ref 26.0–34.0)
MCHC: 33.1 g/dL (ref 30.0–36.0)
MCV: 89.3 fL (ref 78.0–100.0)
Platelets: 217 10*3/uL (ref 150–400)
RDW: 13.4 % (ref 11.5–15.5)
WBC: 7 10*3/uL (ref 4.0–10.5)

## 2011-11-06 LAB — RPR

## 2011-11-07 LAB — T-HELPER CELL (CD4) - (RCID CLINIC ONLY): CD4 T Cell Abs: 660 uL (ref 400–2700)

## 2011-11-19 ENCOUNTER — Ambulatory Visit (INDEPENDENT_AMBULATORY_CARE_PROVIDER_SITE_OTHER): Payer: Medicare HMO | Admitting: Internal Medicine

## 2011-11-19 ENCOUNTER — Encounter: Payer: Self-pay | Admitting: Internal Medicine

## 2011-11-19 VITALS — BP 137/86 | HR 69 | Temp 98.3°F | Wt 279.0 lb

## 2011-11-19 DIAGNOSIS — B20 Human immunodeficiency virus [HIV] disease: Secondary | ICD-10-CM

## 2011-11-19 MED ORDER — SILDENAFIL CITRATE 100 MG PO TABS: 100.0000 mg | ORAL_TABLET | Freq: Every day | ORAL | Status: DC | PRN

## 2011-11-19 NOTE — Progress Notes (Signed)
  Subjective:    Patient ID: Justin Joyce, male    DOB: April 21, 1961, 51 y.o.   MRN: [id number]  HPI He comes in for followup of his HIV. She has been on Isentress and Truvada and continues to do well. He has in fact remained undetectable for several years. He reports 100% compliance. He has no new complaints. He does continue to have erectile dysfunction and tells me his Viagra does better than Cialis. He also continues to smoke. He is aware of the risk of smoking and is attempting to quit on his own. He also has turned 51 this year and has an appointment with his primary physician where he will discuss colon cancer screening.   Review of Systems  Constitutional: Negative for fever, chills and fatigue.  HENT: Negative for sore throat and trouble swallowing.   Respiratory: Negative for cough and shortness of breath.   Cardiovascular: Negative for chest pain, palpitations and leg swelling.  Gastrointestinal: Negative for nausea, abdominal pain, diarrhea and abdominal distention.  Musculoskeletal: Negative for myalgias, joint swelling and arthralgias.  Skin: Negative for rash.  Neurological: Negative for weakness, numbness and headaches.  Hematological: Negative for adenopathy.  Psychiatric/Behavioral: Negative for dysphoric mood. The patient is not nervous/anxious.        Objective:   Physical Exam  Constitutional: He appears well-developed and well-nourished. No distress.  HENT:  Mouth/Throat: Oropharynx is clear and moist. No oropharyngeal exudate.  Cardiovascular: Normal rate, regular rhythm and normal heart sounds.  Exam reveals no gallop and no friction rub.   No murmur heard. Pulmonary/Chest: Effort normal and breath sounds normal. No respiratory distress. He has no wheezes. He has no rales.  Abdominal: Soft. Bowel sounds are normal. He exhibits no distension. There is no tenderness. There is no rebound.  Lymphadenopathy:    He has no cervical adenopathy.  Skin: Skin is warm and  dry. No rash noted.  Psychiatric: He has a normal mood and affect. His behavior is normal.          Assessment & Plan:

## 2011-11-20 ENCOUNTER — Encounter: Payer: Self-pay | Admitting: Internal Medicine

## 2011-11-20 NOTE — Assessment & Plan Note (Signed)
He is doing well and continue with his current regimen. He will discuss the other findings with his primary physician regarding colon cancer screening. I also discussed with him that his blood pressure is elevated though he quit smoking and continues to exercise he may not need medication at this time. This will be followed. He does not use condoms with all sexual activity

## 2012-01-07 ENCOUNTER — Telehealth: Payer: Self-pay | Admitting: *Deleted

## 2012-01-07 ENCOUNTER — Other Ambulatory Visit: Payer: Self-pay | Admitting: *Deleted

## 2012-01-07 DIAGNOSIS — G4700 Insomnia, unspecified: Secondary | ICD-10-CM

## 2012-01-07 MED ORDER — TRAZODONE HCL 100 MG PO TABS: 150.0000 mg | ORAL_TABLET | Freq: Every day | ORAL | Status: DC

## 2012-01-07 NOTE — Telephone Encounter (Signed)
Patient came into clinic requesting refill on Trazodone, previously prescribed by Dr. Ninetta Lights.  Rx sent to [facility] mail order. Justin Joyce

## 2012-02-07 ENCOUNTER — Other Ambulatory Visit: Payer: Self-pay | Admitting: Infectious Diseases

## 2012-02-13 ENCOUNTER — Ambulatory Visit: Payer: Medicare HMO

## 2012-04-15 ENCOUNTER — Other Ambulatory Visit: Payer: Self-pay | Admitting: Internal Medicine

## 2012-04-15 ENCOUNTER — Other Ambulatory Visit: Payer: Self-pay | Admitting: Licensed Clinical Social Worker

## 2012-04-15 DIAGNOSIS — B20 Human immunodeficiency virus [HIV] disease: Secondary | ICD-10-CM

## 2012-04-15 MED ORDER — RALTEGRAVIR POTASSIUM 400 MG PO TABS: 400.0000 mg | ORAL_TABLET | Freq: Two times a day (BID) | ORAL | Status: DC

## 2012-04-15 MED ORDER — EMTRICITABINE-TENOFOVIR DF 200-300 MG PO TABS: 1.0000 | ORAL_TABLET | Freq: Every day | ORAL | Status: DC

## 2012-05-07 ENCOUNTER — Other Ambulatory Visit (INDEPENDENT_AMBULATORY_CARE_PROVIDER_SITE_OTHER): Payer: Medicare HMO

## 2012-05-07 DIAGNOSIS — B20 Human immunodeficiency virus [HIV] disease: Secondary | ICD-10-CM

## 2012-05-07 LAB — CBC WITH DIFFERENTIAL/PLATELET
Basophils Absolute: 0 10*3/uL (ref 0.0–0.1)
Eosinophils Absolute: 0.1 10*3/uL (ref 0.0–0.7)
Eosinophils Relative: 2 % (ref 0–5)
Lymphs Abs: 2.1 10*3/uL (ref 0.7–4.0)
MCH: 29.8 pg (ref 26.0–34.0)
MCV: 87.5 fL (ref 78.0–100.0)
Platelets: 230 10*3/uL (ref 150–400)
RDW: 14.1 % (ref 11.5–15.5)

## 2012-05-08 LAB — HIV-1 RNA QUANT-NO REFLEX-BLD: HIV 1 RNA Quant: <20 copies/mL (ref <20)

## 2012-05-08 LAB — T-HELPER CELL (CD4) - (RCID CLINIC ONLY): CD4 T Cell Abs: 640 uL (ref 400–2700)

## 2012-05-08 LAB — COMPREHENSIVE METABOLIC PANEL
ALT: 44 U/L (ref 0–53)
CO2: 30 mEq/L (ref 19–32)
Creat: 0.97 mg/dL (ref 0.50–1.35)
Total Bilirubin: 0.5 mg/dL (ref 0.3–1.2)

## 2012-05-26 ENCOUNTER — Ambulatory Visit (INDEPENDENT_AMBULATORY_CARE_PROVIDER_SITE_OTHER): Payer: Medicare HMO | Admitting: Internal Medicine

## 2012-05-26 ENCOUNTER — Encounter: Payer: Self-pay | Admitting: Internal Medicine

## 2012-05-26 VITALS — BP 157/91 | HR 69 | Temp 97.8°F | Ht 77.0 in | Wt 292.0 lb

## 2012-05-26 DIAGNOSIS — B20 Human immunodeficiency virus [HIV] disease: Secondary | ICD-10-CM

## 2012-05-26 DIAGNOSIS — Z23 Encounter for immunization: Secondary | ICD-10-CM

## 2012-05-26 DIAGNOSIS — Z113 Encounter for screening for infections with a predominantly sexual mode of transmission: Secondary | ICD-10-CM

## 2012-05-26 MED ORDER — ELVITEG-COBIC-EMTRICIT-TENOFDF 150-150-200-300 MG PO TABS: 1.0000 | ORAL_TABLET | Freq: Every day | ORAL | Status: DC

## 2012-05-26 NOTE — Progress Notes (Signed)
  Subjective:    Patient ID: Justin Joyce, male    DOB: 09-28-1960, 51 y.o.   MRN: [id number]  HPI He comes in for routine followup. He continues to have excellent compliance and remains on his regimen of Truvada and Isentress and denies any missed doses. He tells me since his last visit that he has completely quit smoking. He feels much better though has had some weight gain but understands that his smoking cessation is the most important. He also has established with a primary care physician.   Review of Systems  Constitutional: Negative for fever, fatigue and unexpected weight change.  HENT: Negative for sore throat and trouble swallowing.   Respiratory: Negative for cough and shortness of breath.   Cardiovascular: Negative for leg swelling.  Gastrointestinal: Negative for nausea, abdominal pain and diarrhea.  Musculoskeletal: Negative for myalgias, joint swelling and arthralgias.  Skin: Negative for rash.  Neurological: Negative for dizziness and headaches.       Objective:   Physical Exam  Constitutional: He appears well-developed and well-nourished. No distress.  HENT:  Mouth/Throat: No oropharyngeal exudate.  Cardiovascular: Normal rate, regular rhythm and normal heart sounds.  Exam reveals no gallop and no friction rub.   No murmur heard. Pulmonary/Chest: Effort normal and breath sounds normal. No respiratory distress. He has no wheezes. He has no rales.          Assessment & Plan:

## 2012-05-26 NOTE — Assessment & Plan Note (Signed)
He continues to do very well. I did discuss with him the possibility of changing to a one pill regimen with the same class of medications in the form of Stribild. He is very pleased with this and will finish his current prescription and this will be changed to Stribild. He will have his labs checked one month after the change in and I will see him in 6 months for repeat labs as well as an office visit. He knows that if he has any issues to call. He has established primary physician who will manage his screening colonoscopy and any other issues.

## 2012-06-02 ENCOUNTER — Other Ambulatory Visit: Payer: Self-pay | Admitting: *Deleted

## 2012-06-02 DIAGNOSIS — B20 Human immunodeficiency virus [HIV] disease: Secondary | ICD-10-CM

## 2012-06-02 MED ORDER — ELVITEG-COBIC-EMTRICIT-TENOFDF 150-150-200-300 MG PO TABS: 1.0000 | ORAL_TABLET | Freq: Every day | ORAL | Status: DC

## 2012-06-04 IMAGING — CR DG LUMBAR SPINE 1V
1 series · 1 of 1 positions shown · non-contrast
Comparison: MRI lumbar spine 09/20/2010.

CLINICAL DATA: L4-5 discectomy.

LUMBAR SPINE - 1 VIEW

[view not recorded]
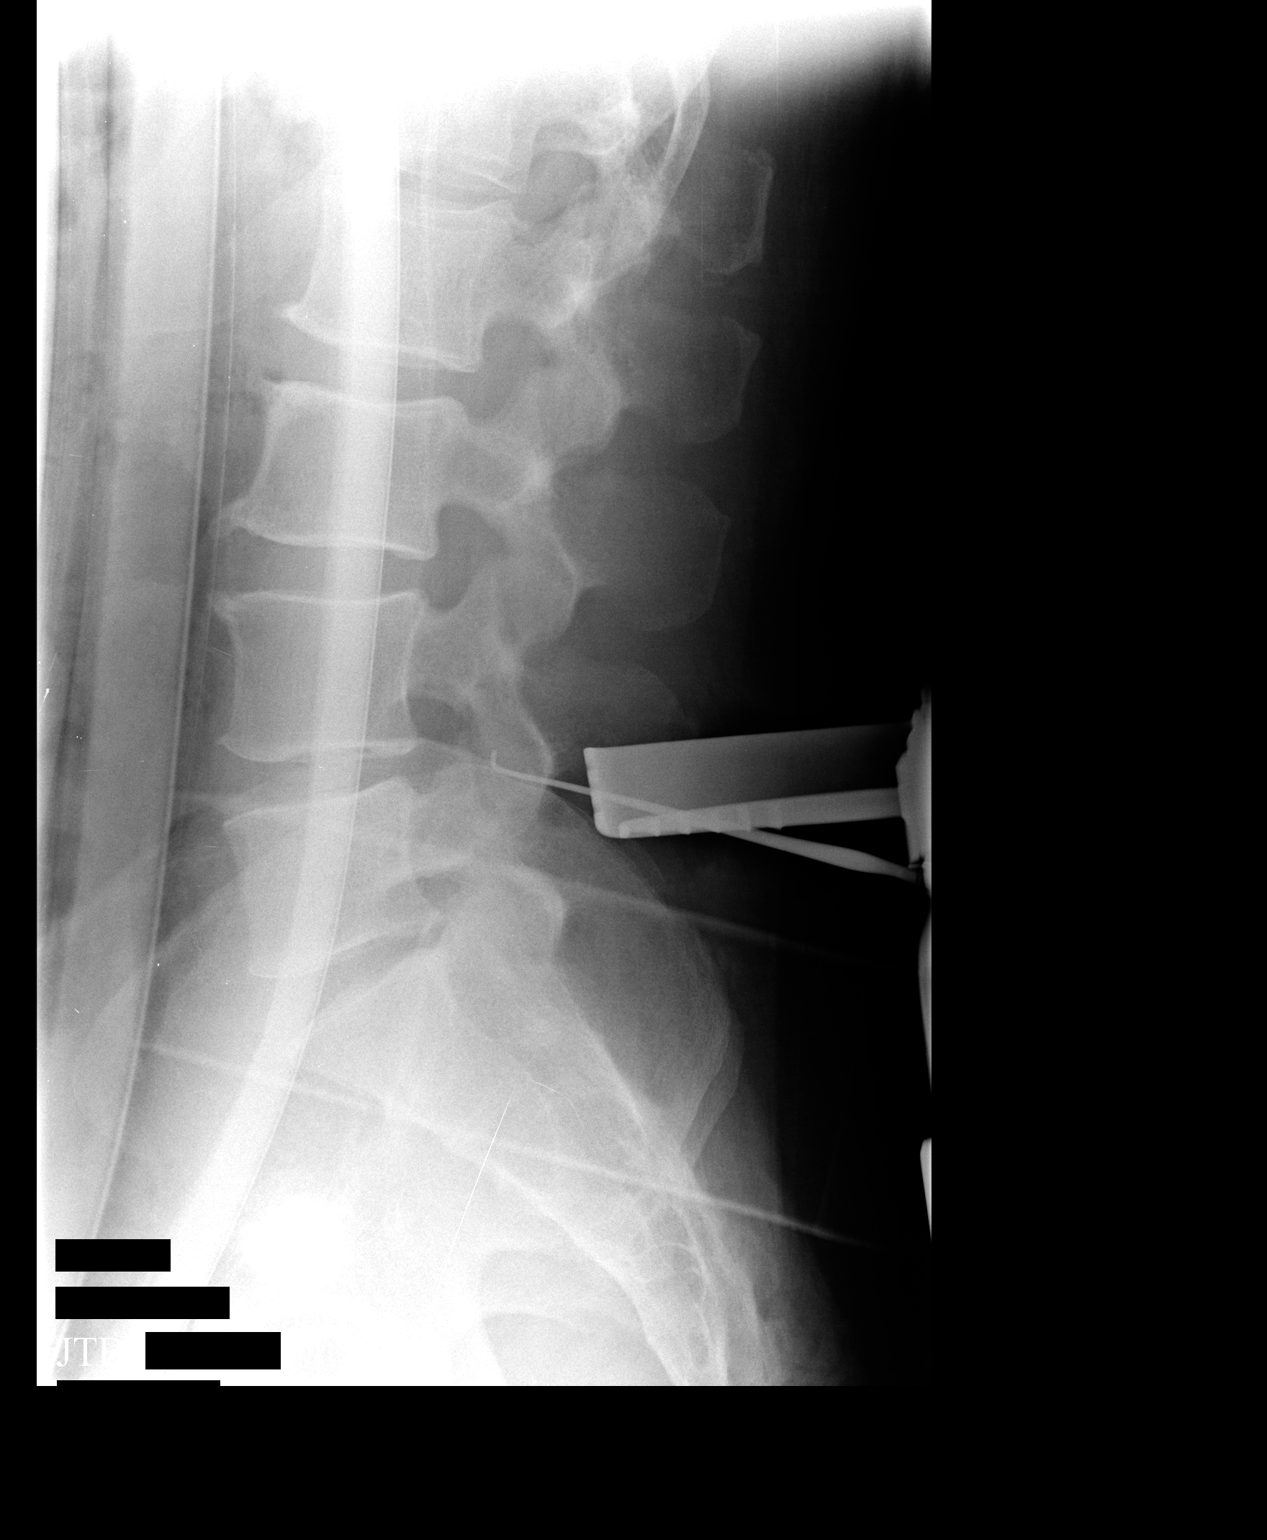

[1 of 1 positions shown; findings below may reference images not displayed]

FINDINGS: Single intraoperative view of the lumbar spine in the
lateral projection shows a probe at L4-5.
IMPRESSION: L4-5 localization.

## 2012-06-10 ENCOUNTER — Other Ambulatory Visit: Payer: Self-pay | Admitting: *Deleted

## 2012-06-10 DIAGNOSIS — B20 Human immunodeficiency virus [HIV] disease: Secondary | ICD-10-CM

## 2012-06-10 MED ORDER — ELVITEG-COBIC-EMTRICIT-TENOFDF 150-150-200-300 MG PO TABS: 1.0000 | ORAL_TABLET | Freq: Every day | ORAL | Status: DC

## 2012-09-01 ENCOUNTER — Ambulatory Visit: Payer: Medicare HMO

## 2012-11-17 ENCOUNTER — Other Ambulatory Visit: Payer: Self-pay | Admitting: Internal Medicine

## 2012-11-17 DIAGNOSIS — M25462 Effusion, left knee: Secondary | ICD-10-CM

## 2012-11-18 ENCOUNTER — Ambulatory Visit
Admission: RE | Admit: 2012-11-18 | Discharge: 2012-11-18 | Disposition: A | Discharge status: Home / Self Care | Payer: Medicare HMO | Source: Ambulatory Visit | Attending: Internal Medicine | Admitting: Internal Medicine

## 2012-11-18 DIAGNOSIS — M25462 Effusion, left knee: Secondary | ICD-10-CM

## 2012-11-27 ENCOUNTER — Other Ambulatory Visit: Payer: Medicare HMO

## 2012-11-27 DIAGNOSIS — B20 Human immunodeficiency virus [HIV] disease: Secondary | ICD-10-CM

## 2012-11-27 DIAGNOSIS — Z113 Encounter for screening for infections with a predominantly sexual mode of transmission: Secondary | ICD-10-CM

## 2012-11-27 LAB — RPR

## 2012-11-30 LAB — HIV-1 RNA QUANT-NO REFLEX-BLD
HIV 1 RNA Quant: <20 copies/mL (ref <20)
HIV-1 RNA Quant, Log: <1.3 {Log} (ref <1.30)

## 2012-12-22 ENCOUNTER — Ambulatory Visit (INDEPENDENT_AMBULATORY_CARE_PROVIDER_SITE_OTHER): Payer: Medicare HMO | Admitting: Internal Medicine

## 2012-12-22 ENCOUNTER — Encounter: Payer: Self-pay | Admitting: Internal Medicine

## 2012-12-22 VITALS — BP 138/78 | HR 78 | Temp 98.3°F | Ht 77.0 in | Wt 287.0 lb

## 2012-12-22 DIAGNOSIS — F17200 Nicotine dependence, unspecified, uncomplicated: Secondary | ICD-10-CM

## 2012-12-22 DIAGNOSIS — B20 Human immunodeficiency virus [HIV] disease: Secondary | ICD-10-CM

## 2012-12-22 MED ORDER — ELVITEG-COBIC-EMTRICIT-TENOFDF 150-150-200-300 MG PO TABS: 1.0000 | ORAL_TABLET | Freq: Every day | ORAL | Status: DC

## 2012-12-22 NOTE — Progress Notes (Signed)
  Subjective:    Patient ID: Justin Joyce, male    DOB: 1960/08/18, 52 y.o.   MRN: [id number]  HPI followup of HIV. He was last seen 6 months ago and he was on Isentress and Truvada and they switched him to once daily Stribild. Since changing to this, he has had no problems and no missed doses. He is pleased with the new regimen. He remains undetectable with a normal CD4 count. He does see her primary physician who does his lipid management and he is scheduled for a screening colonoscopy.   Review of Systems  Constitutional: Negative for fatigue and unexpected weight change.  HENT: Negative for sore throat and trouble swallowing.   Gastrointestinal: Negative for diarrhea.  Skin: Negative for rash.  Neurological: Negative for dizziness and headaches.       Objective:   Physical Exam  Constitutional: He appears well-developed and well-nourished. No distress.  HENT:  Mouth/Throat: Oropharynx is clear and moist. No oropharyngeal exudate.  Cardiovascular: Normal rate, regular rhythm and normal heart sounds.  Exam reveals no gallop and no friction rub.   No murmur heard. Pulmonary/Chest: Effort normal and breath sounds normal. No respiratory distress. He has no wheezes.  Lymphadenopathy:    He has no cervical adenopathy.          Assessment & Plan:

## 2012-12-22 NOTE — Assessment & Plan Note (Signed)
He is doing well with his current regimen and will continue. Return to clinic in 6 months

## 2012-12-22 NOTE — Assessment & Plan Note (Signed)
Continued cessation.

## 2013-01-01 ENCOUNTER — Other Ambulatory Visit: Payer: Self-pay | Admitting: Gastroenterology

## 2013-03-02 ENCOUNTER — Encounter (HOSPITAL_COMMUNITY): Payer: Self-pay | Admitting: *Deleted

## 2013-03-02 ENCOUNTER — Encounter (HOSPITAL_COMMUNITY): Payer: Self-pay | Admitting: Pharmacy Technician

## 2013-03-19 ENCOUNTER — Ambulatory Visit: Payer: Medicare HMO

## 2013-03-24 ENCOUNTER — Encounter (HOSPITAL_COMMUNITY): Payer: Self-pay | Admitting: *Deleted

## 2013-03-24 ENCOUNTER — Encounter (HOSPITAL_COMMUNITY): Payer: Self-pay | Admitting: Anesthesiology

## 2013-03-24 ENCOUNTER — Encounter (HOSPITAL_COMMUNITY)
Admission: RE | Disposition: A | Discharge status: Home / Self Care | Payer: Self-pay | Source: Ambulatory Visit | Attending: Gastroenterology

## 2013-03-24 ENCOUNTER — Ambulatory Visit (HOSPITAL_COMMUNITY): Payer: Medicare HMO | Admitting: Anesthesiology

## 2013-03-24 ENCOUNTER — Ambulatory Visit (HOSPITAL_COMMUNITY)
Admission: RE | Admit: 2013-03-24 | Discharge: 2013-03-24 | Disposition: A | Discharge status: Home / Self Care | Payer: Medicare HMO | Source: Ambulatory Visit | Attending: Gastroenterology | Admitting: Gastroenterology

## 2013-03-24 DIAGNOSIS — Z87891 Personal history of nicotine dependence: Secondary | ICD-10-CM | POA: Insufficient documentation

## 2013-03-24 DIAGNOSIS — J4489 Other specified chronic obstructive pulmonary disease: Secondary | ICD-10-CM | POA: Insufficient documentation

## 2013-03-24 DIAGNOSIS — K449 Diaphragmatic hernia without obstruction or gangrene: Secondary | ICD-10-CM | POA: Insufficient documentation

## 2013-03-24 DIAGNOSIS — Z1211 Encounter for screening for malignant neoplasm of colon: Secondary | ICD-10-CM | POA: Insufficient documentation

## 2013-03-24 DIAGNOSIS — Z882 Allergy status to sulfonamides status: Secondary | ICD-10-CM | POA: Insufficient documentation

## 2013-03-24 DIAGNOSIS — F329 Major depressive disorder, single episode, unspecified: Secondary | ICD-10-CM | POA: Insufficient documentation

## 2013-03-24 DIAGNOSIS — J449 Chronic obstructive pulmonary disease, unspecified: Secondary | ICD-10-CM | POA: Insufficient documentation

## 2013-03-24 DIAGNOSIS — J309 Allergic rhinitis, unspecified: Secondary | ICD-10-CM | POA: Insufficient documentation

## 2013-03-24 DIAGNOSIS — M51379 Other intervertebral disc degeneration, lumbosacral region without mention of lumbar back pain or lower extremity pain: Secondary | ICD-10-CM | POA: Insufficient documentation

## 2013-03-24 DIAGNOSIS — Z21 Asymptomatic human immunodeficiency virus [HIV] infection status: Secondary | ICD-10-CM | POA: Insufficient documentation

## 2013-03-24 DIAGNOSIS — M5137 Other intervertebral disc degeneration, lumbosacral region: Secondary | ICD-10-CM | POA: Insufficient documentation

## 2013-03-24 DIAGNOSIS — G43909 Migraine, unspecified, not intractable, without status migrainosus: Secondary | ICD-10-CM | POA: Insufficient documentation

## 2013-03-24 DIAGNOSIS — F3289 Other specified depressive episodes: Secondary | ICD-10-CM | POA: Insufficient documentation

## 2013-03-24 HISTORY — DX: Personal history of other diseases of the digestive system: Z87.19

## 2013-03-24 HISTORY — DX: Asymptomatic human immunodeficiency virus (hiv) infection status: Z21

## 2013-03-24 HISTORY — PX: COLONOSCOPY WITH PROPOFOL: SHX5780

## 2013-03-24 HISTORY — DX: Chronic obstructive pulmonary disease, unspecified: J44.9

## 2013-03-24 SURGERY — COLONOSCOPY WITH PROPOFOL
Anesthesia: Monitor Anesthesia Care

## 2013-03-24 MED ORDER — SODIUM CHLORIDE 0.9 % IV SOLN: INTRAVENOUS | Status: DC

## 2013-03-24 MED ORDER — PROPOFOL 10 MG/ML IV BOLUS
INTRAVENOUS | Status: DC | PRN
  Administered 2013-03-24: 100 mg via INTRAVENOUS

## 2013-03-24 MED ORDER — LACTATED RINGERS IV SOLN
INTRAVENOUS | Status: DC
  Administered 2013-03-24: 13:00:00 via INTRAVENOUS
  Administered 2013-03-24: 1000 mL via INTRAVENOUS

## 2013-03-24 MED ORDER — PROPOFOL INFUSION 10 MG/ML OPTIME
INTRAVENOUS | Status: DC | PRN
  Administered 2013-03-24: 120 ug/kg/min via INTRAVENOUS

## 2013-03-24 SURGICAL SUPPLY — 21 items

## 2013-03-24 NOTE — Anesthesia Preprocedure Evaluation (Addendum)
Anesthesia Evaluation  Patient identified by MRN, date of birth, ID band Patient awake    Reviewed: Allergy & Precautions, H&P , NPO status , Patient's Chart, lab work & pertinent test results  Airway       Dental  (+) Dental Advisory Given   Pulmonary COPD COPD inhaler, former smoker,          Cardiovascular negative cardio ROS      Neuro/Psych  Headaches, PSYCHIATRIC DISORDERS Depression    GI/Hepatic Neg liver ROS, hiatal hernia,   Endo/Other  negative endocrine ROS  Renal/GU negative Renal ROS     Musculoskeletal negative musculoskeletal ROS (+)   Abdominal   Peds  Hematology  (+) HIV,   Anesthesia Other Findings   Reproductive/Obstetrics                          Anesthesia Physical Anesthesia Plan  ASA: III  Anesthesia Plan: MAC   Post-op Pain Management:    Induction: Intravenous  Airway Management Planned:   Additional Equipment:   Intra-op Plan:   Post-operative Plan:   Informed Consent: I have reviewed the patients History and Physical, chart, labs and discussed the procedure including the risks, benefits and alternatives for the proposed anesthesia with the patient or authorized representative who has indicated his/her understanding and acceptance.   Dental advisory given  Plan Discussed with: CRNA  Anesthesia Plan Comments:         Anesthesia Quick Evaluation

## 2013-03-24 NOTE — Preoperative (Signed)
Beta Blockers   Reason not to administer Beta Blockers:Not Applicable 

## 2013-03-24 NOTE — H&P (Signed)
  Procedure: Baseline screening colonoscopy  History: The patient is a 52 year old male born 06/03/1961. The patient is scheduled to undergo his first screening colonoscopy with polypectomy to prevent colon cancer.  Past medical history: HIV since 1997. Depression. Migraine headache syndrome. Allergic rhinitis. Lumbar degenerative disc disease. Rectal fissure repair.  Habits: The patient quit smoking cigarettes in 2013.  Allergies: Sulfa drugs cause hives  Exam: The patient is alert and lying comfortably on the endoscopy stretcher. Abdomen is soft and nontender to palpation. Lungs are clear to auscultation. Cardiac exam reveals a regular rhythm.   Plan: Proceed with baseline screening colonoscopy

## 2013-03-24 NOTE — Op Note (Signed)
Procedure: Baseline screening colonoscopy  Endoscopist: Danise Edge  Premedication: Propofol administered by anesthesia  Procedure: The patient was placed in the left lateral decubitus position. The Pentax pediatric colonoscope was introduced into the rectum and advanced to the cecum. A normal-appearing appendiceal orifice and ileocecal valve were identified. Colonic preparation for the exam today was good.  Rectum. Normal. Retroflexed view of the distal rectum.  Sigmoid colon and descending colon. Normal.  Splenic flexure. Normal.  Transverse colon. Normal.  Hepatic flexure. Normal.  Ascending colon. Normal.  Cecum and ileocecal valve. Normal.  Assessment: Normal baseline screening colonoscopy  Recommendations: Schedule repeat screening colonoscopy in 10 years

## 2013-03-24 NOTE — Anesthesia Postprocedure Evaluation (Signed)
Anesthesia Post Note  Patient: Justin Joyce  Procedure(s) Performed: Procedure(s) (LRB): COLONOSCOPY WITH PROPOFOL (N/A)  Anesthesia type: MAC  Patient location: PACU  Post pain: Pain level controlled  Post assessment: Post-op Vital signs reviewed  Last Vitals: BP 147/84  Pulse 53  Temp(Src) 36.6 C (Oral)  Resp 16  Ht 6\' 5"  (1.956 m)  Wt 280 lb (127.007 kg)  BMI 33.2 kg/m2  SpO2 99%  Post vital signs: Reviewed  Level of consciousness: awake  Complications: No apparent anesthesia complications

## 2013-03-24 NOTE — Transfer of Care (Signed)
Immediate Anesthesia Transfer of Care Note  Patient: Justin Joyce  Procedure(s) Performed: Procedure(s) (LRB): COLONOSCOPY WITH PROPOFOL (N/A)  Patient Location: PACU  Anesthesia Type: MAC  Level of Consciousness: sedated, patient cooperative and responds to stimulaton  Airway & Oxygen Therapy: Patient Spontanous Breathing and Patient connected to face mask oxgen  Post-op Assessment: Report given to PACU RN and Post -op Vital signs reviewed and stable  Post vital signs: Reviewed and stable  Complications: No apparent anesthesia complications

## 2013-03-25 ENCOUNTER — Encounter (HOSPITAL_COMMUNITY): Payer: Self-pay | Admitting: Gastroenterology

## 2013-05-28 ENCOUNTER — Other Ambulatory Visit: Payer: Self-pay

## 2013-05-29 ENCOUNTER — Other Ambulatory Visit: Payer: Self-pay | Admitting: *Deleted

## 2013-05-29 DIAGNOSIS — B20 Human immunodeficiency virus [HIV] disease: Secondary | ICD-10-CM

## 2013-05-29 MED ORDER — ELVITEG-COBIC-EMTRICIT-TENOFDF 150-150-200-300 MG PO TABS: 1.0000 | ORAL_TABLET | Freq: Every day | ORAL | Status: DC

## 2013-06-09 ENCOUNTER — Other Ambulatory Visit: Payer: Commercial Managed Care - HMO

## 2013-06-09 ENCOUNTER — Other Ambulatory Visit (HOSPITAL_COMMUNITY)
Admission: RE | Admit: 2013-06-09 | Discharge: 2013-06-09 | Disposition: A | Discharge status: Home / Self Care | Payer: Medicare HMO | Source: Ambulatory Visit | Attending: Infectious Diseases | Admitting: Infectious Diseases

## 2013-06-09 DIAGNOSIS — Z79899 Other long term (current) drug therapy: Secondary | ICD-10-CM

## 2013-06-09 DIAGNOSIS — Z113 Encounter for screening for infections with a predominantly sexual mode of transmission: Secondary | ICD-10-CM | POA: Insufficient documentation

## 2013-06-09 DIAGNOSIS — B20 Human immunodeficiency virus [HIV] disease: Secondary | ICD-10-CM

## 2013-06-09 LAB — LIPID PANEL
Cholesterol: 151 mg/dL (ref 0–200)
HDL: 34 mg/dL — ABNORMAL LOW
LDL Cholesterol: 42 mg/dL (ref 0–99)
Total CHOL/HDL Ratio: 4.4 ratio
Triglycerides: 375 mg/dL — ABNORMAL HIGH
VLDL: 75 mg/dL — ABNORMAL HIGH (ref 0–40)

## 2013-06-09 LAB — COMPLETE METABOLIC PANEL WITH GFR
Albumin: 4.5 g/dL (ref 3.5–5.2)
Alkaline Phosphatase: 93 U/L (ref 39–117)
BUN: 14 mg/dL (ref 6–23)
Calcium: 9.3 mg/dL (ref 8.4–10.5)
Creat: 1.11 mg/dL (ref 0.50–1.35)
GFR, Est Non African American: 76 mL/min
Glucose, Bld: 97 mg/dL (ref 70–99)
Potassium: 4.6 mEq/L (ref 3.5–5.3)

## 2013-06-09 LAB — CBC WITH DIFFERENTIAL/PLATELET
Basophils Absolute: 0 10*3/uL (ref 0.0–0.1)
HCT: 42.6 % (ref 39.0–52.0)
Lymphocytes Relative: 30 % (ref 12–46)
Lymphs Abs: 2.2 10*3/uL (ref 0.7–4.0)
Neutro Abs: 4.4 10*3/uL (ref 1.7–7.7)
Platelets: 247 10*3/uL (ref 150–400)
RBC: 4.86 MIL/uL (ref 4.22–5.81)
RDW: 14.4 % (ref 11.5–15.5)
WBC: 7.3 10*3/uL (ref 4.0–10.5)

## 2013-06-23 ENCOUNTER — Encounter: Payer: Self-pay | Admitting: Internal Medicine

## 2013-06-23 ENCOUNTER — Ambulatory Visit (INDEPENDENT_AMBULATORY_CARE_PROVIDER_SITE_OTHER): Payer: Commercial Managed Care - HMO | Admitting: Internal Medicine

## 2013-06-23 VITALS — BP 130/87 | HR 83 | Temp 98.3°F | Ht 77.0 in | Wt 290.0 lb

## 2013-06-23 DIAGNOSIS — B20 Human immunodeficiency virus [HIV] disease: Secondary | ICD-10-CM

## 2013-06-23 DIAGNOSIS — Z113 Encounter for screening for infections with a predominantly sexual mode of transmission: Secondary | ICD-10-CM

## 2013-06-23 DIAGNOSIS — Z79899 Other long term (current) drug therapy: Secondary | ICD-10-CM

## 2013-06-23 NOTE — Assessment & Plan Note (Signed)
He is doing well on his Stribild and will continue with the same. No changes at this time. Labs all reassuring including the LDL cholesterol. He will continue to get his with a panel followed by his primary physician and otherwise I will continue with yearly labs. He is going to return in one year and knows to call if he has any issues prior to the year.

## 2013-06-23 NOTE — Progress Notes (Signed)
  Subjective:    Patient ID: Justin Joyce, male    DOB: April 28, 1961, 52 y.o.   MRN: [id number]  HPI  followup of HIV. He was last seen 6 months ago and he was on [place] and [place] and they switched him to once daily [place]rib[place]. Since changing to this, he has had no problems and no missed doses. He is pleased with the new regimen. He remains undetectable with a normal CD4 count. He does see her primary physician who does his lipid management and he is scheduled for a screening colonoscopy.   Review of Systems  Constitutional: Negative for fatigue and unexpected weight change.  HENT: Negative for sore throat and trouble swallowing.   Eyes: Negative for visual disturbance.  Respiratory: Negative for shortness of breath.   Cardiovascular: Negative for chest pain.  Gastrointestinal: Negative for diarrhea.  Musculoskeletal: Negative for joint swelling.  Skin: Negative for rash.  Neurological: Negative for dizziness and headaches.  Hematological: Negative for adenopathy.  Psychiatric/Behavioral: Negative for dysphoric mood.       Objective:   Physical Exam  Constitutional: He is oriented to person, place, and time. He appears well-developed and well-nourished. No distress.  HENT:  Mouth/Throat: Oropharynx is clear and moist. No oropharyngeal exudate.  Eyes: Right eye exhibits no discharge. Left eye exhibits no discharge. No scleral icterus.  Cardiovascular: Normal rate, regular rhythm and normal heart sounds.  Exam reveals no gallop and no friction rub.   No murmur heard. Pulmonary/Chest: Effort normal and breath sounds normal. No respiratory distress. He has no wheezes.  Lymphadenopathy:    He has no cervical adenopathy.  Neurological: He is alert and oriented to person, place, and time.  Skin: Skin is warm and dry. No rash noted.  Psychiatric: He has a normal mood and affect.          Assessment & Plan:

## 2013-09-07 ENCOUNTER — Ambulatory Visit: Payer: Medicare HMO

## 2013-09-10 ENCOUNTER — Telehealth: Payer: Self-pay | Admitting: *Deleted

## 2013-09-10 DIAGNOSIS — B20 Human immunodeficiency virus [HIV] disease: Secondary | ICD-10-CM

## 2013-09-10 MED ORDER — ELVITEG-COBIC-EMTRICIT-TENOFDF 150-150-200-300 MG PO TABS: 1.0000 | ORAL_TABLET | Freq: Every day | ORAL | Status: DC

## 2013-09-10 NOTE — Telephone Encounter (Signed)
ADAP application 

## 2014-04-02 ENCOUNTER — Other Ambulatory Visit: Payer: Self-pay | Admitting: *Deleted

## 2014-04-02 DIAGNOSIS — B20 Human immunodeficiency virus [HIV] disease: Secondary | ICD-10-CM

## 2014-04-02 MED ORDER — ELVITEG-COBIC-EMTRICIT-TENOFDF 150-150-200-300 MG PO TABS: 1.0000 | ORAL_TABLET | Freq: Every day | ORAL | Status: DC

## 2014-04-06 ENCOUNTER — Other Ambulatory Visit: Payer: Commercial Managed Care - HMO

## 2014-04-06 DIAGNOSIS — B20 Human immunodeficiency virus [HIV] disease: Secondary | ICD-10-CM

## 2014-04-06 DIAGNOSIS — Z113 Encounter for screening for infections with a predominantly sexual mode of transmission: Secondary | ICD-10-CM

## 2014-04-06 LAB — CBC WITH DIFFERENTIAL/PLATELET
BASOS ABS: 0.1 10*3/uL (ref 0.0–0.1)
Basophils Relative: 1 % (ref 0–1)
EOS PCT: 3 % (ref 0–5)
Eosinophils Absolute: 0.2 10*3/uL (ref 0.0–0.7)
HEMATOCRIT: 43.1 % (ref 39.0–52.0)
Hemoglobin: 14.8 g/dL (ref 13.0–17.0)
LYMPHS ABS: 2.1 10*3/uL (ref 0.7–4.0)
LYMPHS PCT: 33 % (ref 12–46)
MCH: 29.8 pg (ref 26.0–34.0)
MCHC: 34.3 g/dL (ref 30.0–36.0)
MCV: 86.9 fL (ref 78.0–100.0)
MONO ABS: 0.6 10*3/uL (ref 0.1–1.0)
Monocytes Relative: 9 % (ref 3–12)
NEUTROS ABS: 3.5 10*3/uL (ref 1.7–7.7)
Neutrophils Relative %: 54 % (ref 43–77)
PLATELETS: 236 10*3/uL (ref 150–400)
RBC: 4.96 MIL/uL (ref 4.22–5.81)
RDW: 15.1 % (ref 11.5–15.5)
WBC: 6.4 10*3/uL (ref 4.0–10.5)

## 2014-04-07 LAB — COMPLETE METABOLIC PANEL WITH GFR
ALK PHOS: 81 U/L (ref 39–117)
ALT: 28 U/L (ref 0–53)
AST: 25 U/L (ref 0–37)
Albumin: 4.5 g/dL (ref 3.5–5.2)
BUN: 13 mg/dL (ref 6–23)
CO2: 27 mEq/L (ref 19–32)
CREATININE: 1.04 mg/dL (ref 0.50–1.35)
Calcium: 9.4 mg/dL (ref 8.4–10.5)
Chloride: 104 mEq/L (ref 96–112)
GFR, EST NON AFRICAN AMERICAN: 82 mL/min
GFR, Est African American: >89 mL/min
GLUCOSE: 60 mg/dL — AB (ref 70–99)
Potassium: 4.5 mEq/L (ref 3.5–5.3)
Sodium: 139 mEq/L (ref 135–145)
Total Bilirubin: 0.5 mg/dL (ref 0.2–1.2)
Total Protein: 7.5 g/dL (ref 6.0–8.3)

## 2014-04-07 LAB — HIV-1 RNA QUANT-NO REFLEX-BLD
HIV 1 RNA QUANT: 26 {copies}/mL — AB (ref <20)
HIV-1 RNA Quant, Log: 1.41 {Log} — ABNORMAL HIGH (ref <1.30)

## 2014-04-07 LAB — T-HELPER CELL (CD4) - (RCID CLINIC ONLY)
CD4 T CELL ABS: 690 /uL (ref 400–2700)
CD4 T CELL HELPER: 34 % (ref 33–55)

## 2014-04-07 LAB — RPR

## 2014-04-20 ENCOUNTER — Ambulatory Visit (INDEPENDENT_AMBULATORY_CARE_PROVIDER_SITE_OTHER): Payer: Commercial Managed Care - HMO | Admitting: Internal Medicine

## 2014-04-20 ENCOUNTER — Encounter: Payer: Self-pay | Admitting: Internal Medicine

## 2014-04-20 VITALS — BP 139/79 | HR 80 | Temp 98.7°F | Wt 295.0 lb

## 2014-04-20 DIAGNOSIS — Z23 Encounter for immunization: Secondary | ICD-10-CM

## 2014-04-20 DIAGNOSIS — Z113 Encounter for screening for infections with a predominantly sexual mode of transmission: Secondary | ICD-10-CM | POA: Insufficient documentation

## 2014-04-20 DIAGNOSIS — B20 Human immunodeficiency virus [HIV] disease: Secondary | ICD-10-CM

## 2014-04-20 NOTE — Assessment & Plan Note (Signed)
Doing great.  Follow up in 1 year.

## 2014-04-20 NOTE — Progress Notes (Signed)
  Subjective:    Patient ID: Justin Joyce, male    DOB: 1961/06/19, 53 y.o.   MRN: [id number]  HPI followup of HIV. He was last seen 9 months ago and changed to once daily Stribild. Since changing to this, he has had no problems and no missed doses. He is pleased with the new regimen. He remains nearly undetectable with a normal CD4 count. He does see her primary physician who does his lipid management.   He follows yearly.   Review of Systems  Constitutional: Negative for fatigue and unexpected weight change.  HENT: Negative for sore throat and trouble swallowing.   Eyes: Negative for visual disturbance.  Respiratory: Negative for shortness of breath.   Cardiovascular: Negative for chest pain.  Gastrointestinal: Negative for diarrhea.  Musculoskeletal: Negative for joint swelling.  Skin: Negative for rash.  Neurological: Negative for dizziness and headaches.  Hematological: Negative for adenopathy.  Psychiatric/Behavioral: Negative for dysphoric mood.       Objective:   Physical Exam  Constitutional: He is oriented to person, place, and time. He appears well-developed and well-nourished. No distress.  HENT:  Mouth/Throat: Oropharynx is clear and moist. No oropharyngeal exudate.  Eyes: Right eye exhibits no discharge. Left eye exhibits no discharge. No scleral icterus.  Cardiovascular: Normal rate, regular rhythm and normal heart sounds.  Exam reveals no gallop and no friction rub.   No murmur heard. Pulmonary/Chest: Effort normal and breath sounds normal. No respiratory distress. He has no wheezes.  Lymphadenopathy:    He has no cervical adenopathy.  Neurological: He is alert and oriented to person, place, and time.  Skin: Skin is warm and dry. No rash noted.  Psychiatric: He has a normal mood and affect.          Assessment & Plan:

## 2014-05-12 ENCOUNTER — Other Ambulatory Visit: Payer: Self-pay | Admitting: *Deleted

## 2014-05-12 DIAGNOSIS — B20 Human immunodeficiency virus [HIV] disease: Secondary | ICD-10-CM

## 2014-05-12 MED ORDER — ELVITEG-COBIC-EMTRICIT-TENOFDF 150-150-200-300 MG PO TABS: 1.0000 | ORAL_TABLET | Freq: Every day | ORAL | Status: DC

## 2014-11-04 ENCOUNTER — Other Ambulatory Visit: Payer: Self-pay | Admitting: Internal Medicine

## 2014-11-04 DIAGNOSIS — B20 Human immunodeficiency virus [HIV] disease: Secondary | ICD-10-CM

## 2014-12-10 ENCOUNTER — Other Ambulatory Visit: Payer: Self-pay | Admitting: Internal Medicine

## 2015-01-08 ENCOUNTER — Other Ambulatory Visit: Payer: Self-pay | Admitting: Internal Medicine

## 2015-01-10 DIAGNOSIS — R590 Localized enlarged lymph nodes: Secondary | ICD-10-CM | POA: Diagnosis not present

## 2015-01-10 DIAGNOSIS — R1032 Left lower quadrant pain: Secondary | ICD-10-CM | POA: Diagnosis not present

## 2015-01-21 DIAGNOSIS — R1900 Intra-abdominal and pelvic swelling, mass and lump, unspecified site: Secondary | ICD-10-CM | POA: Diagnosis not present

## 2015-01-21 DIAGNOSIS — L089 Local infection of the skin and subcutaneous tissue, unspecified: Secondary | ICD-10-CM | POA: Diagnosis not present

## 2015-01-21 DIAGNOSIS — L723 Sebaceous cyst: Secondary | ICD-10-CM | POA: Diagnosis not present

## 2015-02-05 ENCOUNTER — Other Ambulatory Visit: Payer: Self-pay | Admitting: Internal Medicine

## 2015-02-05 DIAGNOSIS — B20 Human immunodeficiency virus [HIV] disease: Secondary | ICD-10-CM

## 2015-02-22 DIAGNOSIS — K529 Noninfective gastroenteritis and colitis, unspecified: Secondary | ICD-10-CM | POA: Diagnosis not present

## 2015-03-23 ENCOUNTER — Other Ambulatory Visit: Payer: Self-pay | Admitting: Internal Medicine

## 2015-03-23 ENCOUNTER — Ambulatory Visit
Admission: RE | Admit: 2015-03-23 | Discharge: 2015-03-23 | Disposition: A | Discharge status: Home / Self Care | Payer: Commercial Managed Care - HMO | Source: Ambulatory Visit | Attending: Internal Medicine | Admitting: Internal Medicine

## 2015-03-23 DIAGNOSIS — M25571 Pain in right ankle and joints of right foot: Secondary | ICD-10-CM

## 2015-03-23 DIAGNOSIS — M7989 Other specified soft tissue disorders: Secondary | ICD-10-CM | POA: Diagnosis not present

## 2015-05-03 ENCOUNTER — Other Ambulatory Visit: Payer: Self-pay | Admitting: Internal Medicine

## 2015-05-04 ENCOUNTER — Other Ambulatory Visit: Payer: Self-pay | Admitting: Internal Medicine

## 2015-05-05 ENCOUNTER — Ambulatory Visit: Payer: Commercial Managed Care - HMO | Admitting: Internal Medicine

## 2015-05-06 ENCOUNTER — Other Ambulatory Visit: Payer: Self-pay | Admitting: *Deleted

## 2015-05-06 DIAGNOSIS — B20 Human immunodeficiency virus [HIV] disease: Secondary | ICD-10-CM

## 2015-05-06 MED ORDER — ELVITEG-COBIC-EMTRICIT-TENOFDF 150-150-200-300 MG PO TABS
ORAL_TABLET | ORAL | Status: DC
Start: 2015-05-06 — End: 2015-07-29

## 2015-07-12 ENCOUNTER — Other Ambulatory Visit: Payer: Commercial Managed Care - HMO

## 2015-07-12 DIAGNOSIS — B20 Human immunodeficiency virus [HIV] disease: Secondary | ICD-10-CM

## 2015-07-12 DIAGNOSIS — Z113 Encounter for screening for infections with a predominantly sexual mode of transmission: Secondary | ICD-10-CM | POA: Diagnosis not present

## 2015-07-12 LAB — COMPLETE METABOLIC PANEL WITH GFR
ALT: 31 U/L (ref 9–46)
AST: 28 U/L (ref 10–35)
Albumin: 4.7 g/dL (ref 3.6–5.1)
Alkaline Phosphatase: 67 U/L (ref 40–115)
BUN: 12 mg/dL (ref 7–25)
CHLORIDE: 101 mmol/L (ref 98–110)
CO2: 25 mmol/L (ref 20–31)
CREATININE: 1.08 mg/dL (ref 0.70–1.33)
Calcium: 9.3 mg/dL (ref 8.6–10.3)
GFR, Est Non African American: 78 mL/min (ref >=60)
GLUCOSE: 97 mg/dL (ref 65–99)
Potassium: 4 mmol/L (ref 3.5–5.3)
SODIUM: 137 mmol/L (ref 135–146)
TOTAL PROTEIN: 7.6 g/dL (ref 6.1–8.1)
Total Bilirubin: 0.5 mg/dL (ref 0.2–1.2)

## 2015-07-13 LAB — T-HELPER CELL (CD4) - (RCID CLINIC ONLY)
CD4 % Helper T Cell: 34 % (ref 33–55)
CD4 T Cell Abs: 760 /uL (ref 400–2700)

## 2015-07-13 LAB — CBC WITH DIFFERENTIAL/PLATELET
BASOS ABS: 0.1 10*3/uL (ref 0.0–0.1)
Basophils Relative: 1 % (ref 0–1)
EOS PCT: 1 % (ref 0–5)
Eosinophils Absolute: 0.1 10*3/uL (ref 0.0–0.7)
HEMATOCRIT: 42.4 % (ref 39.0–52.0)
Hemoglobin: 14.5 g/dL (ref 13.0–17.0)
LYMPHS ABS: 2.2 10*3/uL (ref 0.7–4.0)
LYMPHS PCT: 32 % (ref 12–46)
MCH: 29.6 pg (ref 26.0–34.0)
MCHC: 34.2 g/dL (ref 30.0–36.0)
MCV: 86.5 fL (ref 78.0–100.0)
MONOS PCT: 7 % (ref 3–12)
MPV: 9.5 fL (ref 8.6–12.4)
Monocytes Absolute: 0.5 10*3/uL (ref 0.1–1.0)
Neutro Abs: 4 10*3/uL (ref 1.7–7.7)
Neutrophils Relative %: 59 % (ref 43–77)
Platelets: 224 10*3/uL (ref 150–400)
RBC: 4.9 MIL/uL (ref 4.22–5.81)
RDW: 14.1 % (ref 11.5–15.5)
WBC: 6.8 10*3/uL (ref 4.0–10.5)

## 2015-07-13 LAB — RPR

## 2015-07-14 LAB — HIV-1 RNA QUANT-NO REFLEX-BLD
HIV 1 RNA QUANT: 40 {copies}/mL — AB (ref <20)
HIV-1 RNA QUANT, LOG: 1.6 {Log_copies}/mL — AB (ref <1.30)

## 2015-07-19 DIAGNOSIS — F324 Major depressive disorder, single episode, in partial remission: Secondary | ICD-10-CM | POA: Diagnosis not present

## 2015-07-19 DIAGNOSIS — Z0000 Encounter for general adult medical examination without abnormal findings: Secondary | ICD-10-CM | POA: Diagnosis not present

## 2015-07-19 DIAGNOSIS — J309 Allergic rhinitis, unspecified: Secondary | ICD-10-CM | POA: Diagnosis not present

## 2015-07-19 DIAGNOSIS — G43909 Migraine, unspecified, not intractable, without status migrainosus: Secondary | ICD-10-CM | POA: Diagnosis not present

## 2015-07-19 DIAGNOSIS — Z21 Asymptomatic human immunodeficiency virus [HIV] infection status: Secondary | ICD-10-CM | POA: Diagnosis not present

## 2015-07-19 DIAGNOSIS — R7309 Other abnormal glucose: Secondary | ICD-10-CM | POA: Diagnosis not present

## 2015-07-19 DIAGNOSIS — Z1389 Encounter for screening for other disorder: Secondary | ICD-10-CM | POA: Diagnosis not present

## 2015-07-19 DIAGNOSIS — M519 Unspecified thoracic, thoracolumbar and lumbosacral intervertebral disc disorder: Secondary | ICD-10-CM | POA: Diagnosis not present

## 2015-07-21 LAB — HLA B*5701: HLA-B*5701 w/rflx HLA-B High: NEGATIVE

## 2015-07-26 ENCOUNTER — Encounter: Payer: Self-pay | Admitting: Internal Medicine

## 2015-07-26 ENCOUNTER — Ambulatory Visit (INDEPENDENT_AMBULATORY_CARE_PROVIDER_SITE_OTHER): Payer: Commercial Managed Care - HMO | Admitting: Internal Medicine

## 2015-07-26 VITALS — BP 144/86 | HR 73 | Temp 98.1°F | Ht 77.0 in | Wt 292.4 lb

## 2015-07-26 DIAGNOSIS — Z113 Encounter for screening for infections with a predominantly sexual mode of transmission: Secondary | ICD-10-CM

## 2015-07-26 DIAGNOSIS — B20 Human immunodeficiency virus [HIV] disease: Secondary | ICD-10-CM

## 2015-07-26 NOTE — Progress Notes (Signed)
  Subjective:    Patient ID: Justin Joyce, male    DOB: 06-16-1961, 55 y.o.   MRN: [id number]  HPI followup of HIV for his yearly visit.  He continues on [place]rib[place]. Since changing to this, he has had no problems and no missed doses. He is pleased with the regimen. He remains nearly undetectable with a normal CD4 count. He does see her primary physician who does his lipid management.   He follows yearly.   Review of Systems  Constitutional: Negative for fatigue and unexpected weight change.  HENT: Negative for sore throat and trouble swallowing.   Gastrointestinal: Negative for diarrhea.  Skin: Negative for rash.  Neurological: Negative for dizziness and headaches.       Objective:   Physical Exam  Constitutional: He appears well-developed and well-nourished. No distress.  HENT:  Mouth/Throat: Oropharynx is clear and moist. No oropharyngeal exudate.  Eyes: Right eye exhibits no discharge. Left eye exhibits no discharge. No scleral icterus.  Cardiovascular: Normal rate, regular rhythm and normal heart sounds.  Exam reveals no gallop and no friction rub.   No murmur heard. Pulmonary/Chest: Effort normal and breath sounds normal. No respiratory distress. He has no wheezes.  Lymphadenopathy:    He has no cervical adenopathy.  Skin: Skin is warm and dry. No rash noted.          Assessment & Plan:

## 2015-07-29 ENCOUNTER — Other Ambulatory Visit: Payer: Self-pay | Admitting: Internal Medicine

## 2015-08-10 DIAGNOSIS — J449 Chronic obstructive pulmonary disease, unspecified: Secondary | ICD-10-CM | POA: Diagnosis not present

## 2016-02-24 ENCOUNTER — Other Ambulatory Visit: Payer: Self-pay | Admitting: Internal Medicine

## 2016-02-24 DIAGNOSIS — B20 Human immunodeficiency virus [HIV] disease: Secondary | ICD-10-CM

## 2016-07-09 ENCOUNTER — Other Ambulatory Visit: Payer: Commercial Managed Care - HMO

## 2016-07-09 DIAGNOSIS — B20 Human immunodeficiency virus [HIV] disease: Secondary | ICD-10-CM | POA: Diagnosis not present

## 2016-07-09 DIAGNOSIS — Z113 Encounter for screening for infections with a predominantly sexual mode of transmission: Secondary | ICD-10-CM

## 2016-07-09 LAB — CBC WITH DIFFERENTIAL/PLATELET
BASOS ABS: 65 {cells}/uL (ref 0–200)
BASOS PCT: 1 %
EOS ABS: 65 {cells}/uL (ref 15–500)
EOS PCT: 1 %
HCT: 44 % (ref 38.5–50.0)
HEMOGLOBIN: 14.7 g/dL (ref 13.2–17.1)
LYMPHS ABS: 1950 {cells}/uL (ref 850–3900)
Lymphocytes Relative: 30 %
MCH: 29.6 pg (ref 27.0–33.0)
MCHC: 33.4 g/dL (ref 32.0–36.0)
MCV: 88.5 fL (ref 80.0–100.0)
MPV: 9.5 fL (ref 7.5–12.5)
Monocytes Absolute: 455 cells/uL (ref 200–950)
Monocytes Relative: 7 %
NEUTROS ABS: 3965 {cells}/uL (ref 1500–7800)
Neutrophils Relative %: 61 %
PLATELETS: 244 10*3/uL (ref 140–400)
RBC: 4.97 MIL/uL (ref 4.20–5.80)
RDW: 14.3 % (ref 11.0–15.0)
WBC: 6.5 10*3/uL (ref 3.8–10.8)

## 2016-07-09 LAB — COMPLETE METABOLIC PANEL WITH GFR
ALK PHOS: 76 U/L (ref 40–115)
ALT: 27 U/L (ref 9–46)
AST: 27 U/L (ref 10–35)
Albumin: 4.5 g/dL (ref 3.6–5.1)
BUN: 14 mg/dL (ref 7–25)
CO2: 28 mmol/L (ref 20–31)
Calcium: 9.4 mg/dL (ref 8.6–10.3)
Chloride: 103 mmol/L (ref 98–110)
Creat: 1.15 mg/dL (ref 0.70–1.33)
GFR, EST AFRICAN AMERICAN: 83 mL/min (ref >=60)
GFR, EST NON AFRICAN AMERICAN: 72 mL/min (ref >=60)
GLUCOSE: 94 mg/dL (ref 65–99)
POTASSIUM: 4.5 mmol/L (ref 3.5–5.3)
SODIUM: 138 mmol/L (ref 135–146)
Total Bilirubin: 0.5 mg/dL (ref 0.2–1.2)
Total Protein: 7.8 g/dL (ref 6.1–8.1)

## 2016-07-10 LAB — RPR

## 2016-07-10 LAB — T-HELPER CELL (CD4) - (RCID CLINIC ONLY)
CD4 % Helper T Cell: 33 % (ref 33–55)
CD4 T Cell Abs: 730 /uL (ref 400–2700)

## 2016-07-11 LAB — HIV-1 RNA QUANT-NO REFLEX-BLD: HIV 1 RNA Quant: <20 copies/mL (ref <20)

## 2016-07-24 ENCOUNTER — Ambulatory Visit (INDEPENDENT_AMBULATORY_CARE_PROVIDER_SITE_OTHER): Payer: Commercial Managed Care - HMO | Admitting: Internal Medicine

## 2016-07-24 VITALS — BP 155/79 | HR 82 | Temp 98.4°F | Ht 77.0 in | Wt 310.0 lb

## 2016-07-24 DIAGNOSIS — Z113 Encounter for screening for infections with a predominantly sexual mode of transmission: Secondary | ICD-10-CM

## 2016-07-24 DIAGNOSIS — B20 Human immunodeficiency virus [HIV] disease: Secondary | ICD-10-CM

## 2016-07-24 MED ORDER — ELVITEG-COBIC-EMTRICIT-TENOFAF 150-150-200-10 MG PO TABS: 1.0000 | ORAL_TABLET | Freq: Every day | ORAL | 11 refills | Status: DC

## 2016-07-24 NOTE — Progress Notes (Signed)
  Subjective:    Patient ID: Justin Joyce, male    DOB: 1961/01/31, 56 y.o.   MRN: [id number]  HPI followup of HIV for his yearly visit.  He continues on Stribild. Since changing to this, he has had no problems and no missed doses. He is pleased with the regimen. He remains nearly undetectable with a normal CD4 count. He does see her primary physician who does his lipid management.   He follows yearly and continues to do well.   Review of Systems  Constitutional: Negative for fatigue and unexpected weight change.  HENT: Negative for sore throat and trouble swallowing.   Gastrointestinal: Negative for diarrhea.  Skin: Negative for rash.  Neurological: Negative for dizziness and headaches.       Objective:   Physical Exam  Constitutional: He appears well-developed and well-nourished. No distress.  HENT:  Mouth/Throat: Oropharynx is clear and moist. No oropharyngeal exudate.  Eyes: Right eye exhibits no discharge. Left eye exhibits no discharge. No scleral icterus.  Cardiovascular: Normal rate, regular rhythm and normal heart sounds.  Exam reveals no gallop and no friction rub.   No murmur heard. Pulmonary/Chest: Effort normal and breath sounds normal. No respiratory distress. He has no wheezes.  Lymphadenopathy:    He has no cervical adenopathy.  Skin: Skin is warm and dry. No rash noted.          Assessment & Plan:

## 2016-07-24 NOTE — Assessment & Plan Note (Signed)
Doing well.  Will change to genvoya with next refill.  rtc 1 year.

## 2016-07-24 NOTE — Assessment & Plan Note (Signed)
Negative rpr

## 2016-08-01 ENCOUNTER — Other Ambulatory Visit: Payer: Self-pay | Admitting: Internal Medicine

## 2016-08-01 ENCOUNTER — Ambulatory Visit
Admission: RE | Admit: 2016-08-01 | Discharge: 2016-08-01 | Disposition: A | Discharge status: Home / Self Care | Payer: Commercial Managed Care - HMO | Source: Ambulatory Visit | Attending: Internal Medicine | Admitting: Internal Medicine

## 2016-08-01 DIAGNOSIS — Z125 Encounter for screening for malignant neoplasm of prostate: Secondary | ICD-10-CM | POA: Diagnosis not present

## 2016-08-01 DIAGNOSIS — M79604 Pain in right leg: Secondary | ICD-10-CM

## 2016-08-01 DIAGNOSIS — Z1159 Encounter for screening for other viral diseases: Secondary | ICD-10-CM | POA: Diagnosis not present

## 2016-08-01 DIAGNOSIS — F324 Major depressive disorder, single episode, in partial remission: Secondary | ICD-10-CM | POA: Diagnosis not present

## 2016-08-01 DIAGNOSIS — Z21 Asymptomatic human immunodeficiency virus [HIV] infection status: Secondary | ICD-10-CM | POA: Diagnosis not present

## 2016-08-01 DIAGNOSIS — Z1389 Encounter for screening for other disorder: Secondary | ICD-10-CM | POA: Diagnosis not present

## 2016-08-01 DIAGNOSIS — J449 Chronic obstructive pulmonary disease, unspecified: Secondary | ICD-10-CM | POA: Diagnosis not present

## 2016-08-01 DIAGNOSIS — Z0000 Encounter for general adult medical examination without abnormal findings: Secondary | ICD-10-CM | POA: Diagnosis not present

## 2016-08-01 DIAGNOSIS — M79651 Pain in right thigh: Secondary | ICD-10-CM | POA: Diagnosis not present

## 2016-08-01 DIAGNOSIS — R7303 Prediabetes: Secondary | ICD-10-CM | POA: Diagnosis not present

## 2016-08-16 ENCOUNTER — Other Ambulatory Visit: Payer: Self-pay | Admitting: Internal Medicine

## 2016-08-16 DIAGNOSIS — M5416 Radiculopathy, lumbar region: Secondary | ICD-10-CM | POA: Diagnosis not present

## 2016-08-16 DIAGNOSIS — M5116 Intervertebral disc disorders with radiculopathy, lumbar region: Secondary | ICD-10-CM

## 2016-08-24 ENCOUNTER — Ambulatory Visit
Admission: RE | Admit: 2016-08-24 | Discharge: 2016-08-24 | Disposition: A | Discharge status: Home / Self Care | Payer: Commercial Managed Care - HMO | Source: Ambulatory Visit | Attending: Internal Medicine | Admitting: Internal Medicine

## 2016-08-24 DIAGNOSIS — M5116 Intervertebral disc disorders with radiculopathy, lumbar region: Secondary | ICD-10-CM

## 2016-08-24 DIAGNOSIS — M5126 Other intervertebral disc displacement, lumbar region: Secondary | ICD-10-CM | POA: Diagnosis not present

## 2016-08-24 MED ORDER — GADOBENATE DIMEGLUMINE 529 MG/ML IV SOLN
20.0000 mL | Freq: Once | INTRAVENOUS | Status: AC | PRN
  Administered 2016-08-24: 20 mL via INTRAVENOUS

## 2016-09-04 DIAGNOSIS — M5126 Other intervertebral disc displacement, lumbar region: Secondary | ICD-10-CM | POA: Diagnosis not present

## 2016-09-27 DIAGNOSIS — M5126 Other intervertebral disc displacement, lumbar region: Secondary | ICD-10-CM | POA: Diagnosis not present

## 2016-10-18 DIAGNOSIS — M5126 Other intervertebral disc displacement, lumbar region: Secondary | ICD-10-CM | POA: Diagnosis not present

## 2016-11-09 DIAGNOSIS — M5126 Other intervertebral disc displacement, lumbar region: Secondary | ICD-10-CM | POA: Diagnosis not present

## 2016-11-28 ENCOUNTER — Other Ambulatory Visit: Payer: Self-pay | Admitting: *Deleted

## 2016-11-28 DIAGNOSIS — B20 Human immunodeficiency virus [HIV] disease: Secondary | ICD-10-CM

## 2016-11-28 MED ORDER — ELVITEG-COBIC-EMTRICIT-TENOFAF 150-150-200-10 MG PO TABS: 1.0000 | ORAL_TABLET | Freq: Every day | ORAL | 3 refills | Status: DC

## 2016-12-31 DIAGNOSIS — M5126 Other intervertebral disc displacement, lumbar region: Secondary | ICD-10-CM | POA: Diagnosis not present

## 2016-12-31 DIAGNOSIS — Z6834 Body mass index (BMI) 34.0-34.9, adult: Secondary | ICD-10-CM | POA: Diagnosis not present

## 2016-12-31 DIAGNOSIS — R030 Elevated blood-pressure reading, without diagnosis of hypertension: Secondary | ICD-10-CM | POA: Diagnosis not present

## 2017-01-15 ENCOUNTER — Other Ambulatory Visit: Payer: Self-pay | Admitting: Neurological Surgery

## 2017-01-15 DIAGNOSIS — M5126 Other intervertebral disc displacement, lumbar region: Secondary | ICD-10-CM

## 2017-01-21 ENCOUNTER — Ambulatory Visit
Admission: RE | Admit: 2017-01-21 | Discharge: 2017-01-21 | Disposition: A | Discharge status: Home / Self Care | Payer: Medicare HMO | Source: Ambulatory Visit | Attending: Neurological Surgery | Admitting: Neurological Surgery

## 2017-01-21 DIAGNOSIS — M5126 Other intervertebral disc displacement, lumbar region: Secondary | ICD-10-CM

## 2017-01-21 DIAGNOSIS — M48061 Spinal stenosis, lumbar region without neurogenic claudication: Secondary | ICD-10-CM | POA: Diagnosis not present

## 2017-02-04 DIAGNOSIS — M5416 Radiculopathy, lumbar region: Secondary | ICD-10-CM | POA: Diagnosis not present

## 2017-02-25 DIAGNOSIS — M5416 Radiculopathy, lumbar region: Secondary | ICD-10-CM | POA: Diagnosis not present

## 2017-03-12 DIAGNOSIS — R030 Elevated blood-pressure reading, without diagnosis of hypertension: Secondary | ICD-10-CM | POA: Diagnosis not present

## 2017-03-12 DIAGNOSIS — M5416 Radiculopathy, lumbar region: Secondary | ICD-10-CM | POA: Diagnosis not present

## 2017-03-12 DIAGNOSIS — Z6834 Body mass index (BMI) 34.0-34.9, adult: Secondary | ICD-10-CM | POA: Diagnosis not present

## 2017-03-29 DIAGNOSIS — M5416 Radiculopathy, lumbar region: Secondary | ICD-10-CM | POA: Diagnosis not present

## 2017-04-05 ENCOUNTER — Other Ambulatory Visit: Payer: Self-pay | Admitting: Neurological Surgery

## 2017-04-05 DIAGNOSIS — M5416 Radiculopathy, lumbar region: Secondary | ICD-10-CM

## 2017-04-17 ENCOUNTER — Ambulatory Visit
Admission: RE | Admit: 2017-04-17 | Discharge: 2017-04-17 | Disposition: A | Discharge status: Home / Self Care | Payer: Medicare HMO | Source: Ambulatory Visit | Attending: Neurological Surgery | Admitting: Neurological Surgery

## 2017-04-17 DIAGNOSIS — M5416 Radiculopathy, lumbar region: Secondary | ICD-10-CM

## 2017-04-17 DIAGNOSIS — M48061 Spinal stenosis, lumbar region without neurogenic claudication: Secondary | ICD-10-CM | POA: Diagnosis not present

## 2017-04-17 MED ORDER — ONDANSETRON HCL 4 MG/2ML IJ SOLN
4.0000 mg | Freq: Once | INTRAMUSCULAR | Status: AC
  Administered 2017-04-17: 4 mg via INTRAMUSCULAR

## 2017-04-17 MED ORDER — MEPERIDINE HCL 100 MG/ML IJ SOLN
100.0000 mg | Freq: Once | INTRAMUSCULAR | Status: AC
  Administered 2017-04-17: 100 mg via INTRAMUSCULAR

## 2017-04-17 MED ORDER — IOPAMIDOL (ISOVUE-M 200) INJECTION 41%
15.0000 mL | Freq: Once | INTRAMUSCULAR | Status: AC
  Administered 2017-04-17: 15 mL via INTRATHECAL

## 2017-04-17 MED ORDER — DIAZEPAM 5 MG PO TABS
10.0000 mg | ORAL_TABLET | Freq: Once | ORAL | Status: AC
  Administered 2017-04-17: 10 mg via ORAL

## 2017-04-17 NOTE — Discharge Instructions (Signed)
Myelogram Discharge Instructions  1. Go home and rest quietly for the next 24 hours.  It is important to lie flat for the next 24 hours.  Get up only to go to the restroom.  You may lie in the bed or on a couch on your back, your stomach, your left side or your right side.  You may have one pillow under your head.  You may have pillows between your knees while you are on your side or under your knees while you are on your back.  2. DO NOT drive today.  Recline the seat as far back as it will go, while still wearing your seat belt, on the way home.  3. You may get up to go to the bathroom as needed.  You may sit up for 10 minutes to eat.  You may resume your normal diet and medications unless otherwise indicated.  Drink lots of extra fluids today and tomorrow.  4. The incidence of headache, nausea, or vomiting is about 5% (one in 20 patients).  If you develop a headache, lie flat and drink plenty of fluids until the headache goes away.  Caffeinated beverages may be helpful.  If you develop severe nausea and vomiting or a headache that does not go away with flat bed rest, call [phone].  5. You may resume normal activities after your 24 hours of bed rest is over; however, do not exert yourself strongly or do any heavy lifting tomorrow. If when you get up you have a headache when standing, go back to bed and force fluids for another 24 hours.  6. Call your physician for a follow-up appointment.  The results of your myelogram will be sent directly to your physician by the following day.  7. If you have any questions or if complications develop after you arrive home, please call [phone].  Discharge instructions have been explained to the patient.  The patient, or the person responsible for the patient, fully understands these instructions.       May resume Trazodone on Sept. 27, 2018, after 9:30 am.

## 2017-04-17 NOTE — Progress Notes (Signed)
Pt states he has been off Trazodone since Sunday.

## 2017-04-19 DIAGNOSIS — R030 Elevated blood-pressure reading, without diagnosis of hypertension: Secondary | ICD-10-CM | POA: Diagnosis not present

## 2017-04-19 DIAGNOSIS — Z6834 Body mass index (BMI) 34.0-34.9, adult: Secondary | ICD-10-CM | POA: Diagnosis not present

## 2017-04-19 DIAGNOSIS — M5416 Radiculopathy, lumbar region: Secondary | ICD-10-CM | POA: Diagnosis not present

## 2017-04-22 DIAGNOSIS — M5416 Radiculopathy, lumbar region: Secondary | ICD-10-CM | POA: Diagnosis not present

## 2017-04-22 DIAGNOSIS — Z01812 Encounter for preprocedural laboratory examination: Secondary | ICD-10-CM | POA: Diagnosis not present

## 2017-05-09 DIAGNOSIS — M5126 Other intervertebral disc displacement, lumbar region: Secondary | ICD-10-CM | POA: Diagnosis not present

## 2017-05-09 DIAGNOSIS — M48062 Spinal stenosis, lumbar region with neurogenic claudication: Secondary | ICD-10-CM | POA: Diagnosis not present

## 2017-05-09 DIAGNOSIS — M5127 Other intervertebral disc displacement, lumbosacral region: Secondary | ICD-10-CM | POA: Diagnosis not present

## 2017-05-09 DIAGNOSIS — M5187 Other intervertebral disc disorders, lumbosacral region: Secondary | ICD-10-CM | POA: Diagnosis not present

## 2017-05-09 DIAGNOSIS — M4807 Spinal stenosis, lumbosacral region: Secondary | ICD-10-CM | POA: Diagnosis not present

## 2017-07-30 ENCOUNTER — Other Ambulatory Visit: Payer: Medicare HMO

## 2017-07-30 ENCOUNTER — Other Ambulatory Visit (HOSPITAL_COMMUNITY)
Admission: RE | Admit: 2017-07-30 | Discharge: 2017-07-30 | Disposition: A | Discharge status: Home / Self Care | Payer: Medicare HMO | Source: Ambulatory Visit | Attending: Internal Medicine | Admitting: Internal Medicine

## 2017-07-30 DIAGNOSIS — Z113 Encounter for screening for infections with a predominantly sexual mode of transmission: Secondary | ICD-10-CM | POA: Diagnosis not present

## 2017-07-30 DIAGNOSIS — B20 Human immunodeficiency virus [HIV] disease: Secondary | ICD-10-CM | POA: Diagnosis not present

## 2017-07-31 LAB — CBC WITH DIFFERENTIAL/PLATELET
Basophils Absolute: 50 cells/uL (ref 0–200)
Basophils Relative: 0.8 %
EOS ABS: 88 {cells}/uL (ref 15–500)
Eosinophils Relative: 1.4 %
HEMATOCRIT: 43.7 % (ref 38.5–50.0)
Hemoglobin: 14.5 g/dL (ref 13.2–17.1)
Lymphs Abs: 1802 cells/uL (ref 850–3900)
MCH: 28.9 pg (ref 27.0–33.0)
MCHC: 33.2 g/dL (ref 32.0–36.0)
MCV: 87.1 fL (ref 80.0–100.0)
MPV: 10.6 fL (ref 7.5–12.5)
Monocytes Relative: 9 %
NEUTROS PCT: 60.2 %
Neutro Abs: 3793 cells/uL (ref 1500–7800)
PLATELETS: 242 10*3/uL (ref 140–400)
RBC: 5.02 10*6/uL (ref 4.20–5.80)
RDW: 13.9 % (ref 11.0–15.0)
TOTAL LYMPHOCYTE: 28.6 %
WBC: 6.3 10*3/uL (ref 3.8–10.8)
WBCMIX: 567 {cells}/uL (ref 200–950)

## 2017-07-31 LAB — URINE CYTOLOGY ANCILLARY ONLY
Chlamydia: NEGATIVE
Neisseria Gonorrhea: NEGATIVE

## 2017-07-31 LAB — COMPLETE METABOLIC PANEL WITH GFR
AG Ratio: 1.4 (calc) (ref 1.0–2.5)
ALT: 14 U/L (ref 9–46)
AST: 19 U/L (ref 10–35)
Albumin: 4.4 g/dL (ref 3.6–5.1)
Alkaline phosphatase (APISO): 77 U/L (ref 40–115)
BILIRUBIN TOTAL: 0.5 mg/dL (ref 0.2–1.2)
BUN: 11 mg/dL (ref 7–25)
CHLORIDE: 105 mmol/L (ref 98–110)
CO2: 27 mmol/L (ref 20–32)
Calcium: 9.4 mg/dL (ref 8.6–10.3)
Creat: 1.08 mg/dL (ref 0.70–1.33)
GFR, EST AFRICAN AMERICAN: 89 mL/min/{1.73_m2} (ref >=60)
GFR, Est Non African American: 77 mL/min/{1.73_m2} (ref >=60)
GLOBULIN: 3.2 g/dL (ref 1.9–3.7)
Glucose, Bld: 104 mg/dL — ABNORMAL HIGH (ref 65–99)
Potassium: 4.1 mmol/L (ref 3.5–5.3)
SODIUM: 141 mmol/L (ref 135–146)
TOTAL PROTEIN: 7.6 g/dL (ref 6.1–8.1)

## 2017-07-31 LAB — T-HELPER CELL (CD4) - (RCID CLINIC ONLY)
CD4 % Helper T Cell: 25 % — ABNORMAL LOW (ref 33–55)
CD4 T CELL ABS: 530 /uL (ref 400–2700)

## 2017-07-31 LAB — RPR: RPR Ser Ql: NONREACTIVE

## 2017-08-01 LAB — HIV-1 RNA QUANT-NO REFLEX-BLD
HIV 1 RNA QUANT: NOT DETECTED {copies}/mL
HIV-1 RNA QUANT, LOG: NOT DETECTED {Log_copies}/mL

## 2017-08-15 DIAGNOSIS — R21 Rash and other nonspecific skin eruption: Secondary | ICD-10-CM | POA: Diagnosis not present

## 2017-09-04 ENCOUNTER — Encounter: Payer: Self-pay | Admitting: Internal Medicine

## 2017-09-04 ENCOUNTER — Ambulatory Visit: Payer: Medicare HMO | Admitting: Internal Medicine

## 2017-09-04 VITALS — BP 163/90 | HR 64 | Temp 98.0°F | Ht 77.0 in | Wt 264.0 lb

## 2017-09-04 DIAGNOSIS — Z113 Encounter for screening for infections with a predominantly sexual mode of transmission: Secondary | ICD-10-CM | POA: Diagnosis not present

## 2017-09-04 DIAGNOSIS — Z5181 Encounter for therapeutic drug level monitoring: Secondary | ICD-10-CM | POA: Diagnosis not present

## 2017-09-04 DIAGNOSIS — B20 Human immunodeficiency virus [HIV] disease: Secondary | ICD-10-CM | POA: Diagnosis not present

## 2017-09-04 NOTE — Assessment & Plan Note (Signed)
Doing well with ARVs, no missed doses.  rtc 1 year.

## 2017-09-04 NOTE — Assessment & Plan Note (Signed)
Screened negative.  Uses condoms with sex always 

## 2017-09-04 NOTE — Assessment & Plan Note (Signed)
Creat, lfts, wnl 

## 2017-09-04 NOTE — Progress Notes (Signed)
  Subjective:    Patient ID: Justin Joyce, male    DOB: May 24, 1961, 57 y.o.   MRN: [id number]  HPI followup of HIV for his yearly visit.  He continues on [place].. He has had no problems and no missed doses. He is pleased with the regimen. He remains suppressed with a normal CD4 count. He does see her primary physician who does his lipid management.   He follows yearly and continues to do well.  No associated weight loss, diarrhea.  Recent back surgery by Dr. Ronnald Ramp and feels better.  Recent worsening of area on left foot, darkened area.  Going to dermatology in April.   Review of Systems  Constitutional: Negative for fatigue and unexpected weight change.  HENT: Negative for sore throat and trouble swallowing.   Gastrointestinal: Negative for diarrhea.  Skin: Negative for rash.  Neurological: Negative for dizziness and headaches.       Objective:   Physical Exam  Constitutional: He appears well-developed and well-nourished. No distress.  HENT:  Mouth/Throat: Oropharynx is clear and moist. No oropharyngeal exudate.  Eyes: Right eye exhibits no discharge. Left eye exhibits no discharge. No scleral icterus.  Cardiovascular: Normal rate, regular rhythm and normal heart sounds. Exam reveals no gallop and no friction rub.  No murmur heard. Pulmonary/Chest: Effort normal and breath sounds normal. No respiratory distress. He has no wheezes.  Lymphadenopathy:    He has no cervical adenopathy.  Skin: Skin is warm and dry. No rash noted.   SH  Previous smoker       Assessment & Plan:

## 2017-09-11 DIAGNOSIS — Z21 Asymptomatic human immunodeficiency virus [HIV] infection status: Secondary | ICD-10-CM | POA: Diagnosis not present

## 2017-09-11 DIAGNOSIS — R739 Hyperglycemia, unspecified: Secondary | ICD-10-CM | POA: Diagnosis not present

## 2017-09-11 DIAGNOSIS — E669 Obesity, unspecified: Secondary | ICD-10-CM | POA: Diagnosis not present

## 2017-09-11 DIAGNOSIS — Z1389 Encounter for screening for other disorder: Secondary | ICD-10-CM | POA: Diagnosis not present

## 2017-09-11 DIAGNOSIS — F324 Major depressive disorder, single episode, in partial remission: Secondary | ICD-10-CM | POA: Diagnosis not present

## 2017-09-11 DIAGNOSIS — J449 Chronic obstructive pulmonary disease, unspecified: Secondary | ICD-10-CM | POA: Diagnosis not present

## 2017-09-11 DIAGNOSIS — Z125 Encounter for screening for malignant neoplasm of prostate: Secondary | ICD-10-CM | POA: Diagnosis not present

## 2017-09-11 DIAGNOSIS — J309 Allergic rhinitis, unspecified: Secondary | ICD-10-CM | POA: Diagnosis not present

## 2017-09-11 DIAGNOSIS — Z0000 Encounter for general adult medical examination without abnormal findings: Secondary | ICD-10-CM | POA: Diagnosis not present

## 2017-09-11 DIAGNOSIS — Z136 Encounter for screening for cardiovascular disorders: Secondary | ICD-10-CM | POA: Diagnosis not present

## 2017-09-11 DIAGNOSIS — Z23 Encounter for immunization: Secondary | ICD-10-CM | POA: Diagnosis not present

## 2017-10-21 ENCOUNTER — Other Ambulatory Visit: Payer: Self-pay | Admitting: Internal Medicine

## 2017-10-21 DIAGNOSIS — B20 Human immunodeficiency virus [HIV] disease: Secondary | ICD-10-CM

## 2018-03-12 DIAGNOSIS — J449 Chronic obstructive pulmonary disease, unspecified: Secondary | ICD-10-CM | POA: Diagnosis not present

## 2018-03-12 DIAGNOSIS — F324 Major depressive disorder, single episode, in partial remission: Secondary | ICD-10-CM | POA: Diagnosis not present

## 2018-03-12 DIAGNOSIS — R7303 Prediabetes: Secondary | ICD-10-CM | POA: Diagnosis not present

## 2018-03-12 DIAGNOSIS — Z21 Asymptomatic human immunodeficiency virus [HIV] infection status: Secondary | ICD-10-CM | POA: Diagnosis not present

## 2018-03-31 ENCOUNTER — Other Ambulatory Visit: Payer: Self-pay

## 2018-03-31 DIAGNOSIS — B20 Human immunodeficiency virus [HIV] disease: Secondary | ICD-10-CM

## 2018-03-31 MED ORDER — ELVITEG-COBIC-EMTRICIT-TENOFAF 150-150-200-10 MG PO TABS: 1.0000 | ORAL_TABLET | Freq: Every day | ORAL | 1 refills | Status: DC

## 2018-03-31 NOTE — Telephone Encounter (Signed)
Script request.   Laverle Patter, RN

## 2018-08-19 ENCOUNTER — Other Ambulatory Visit (HOSPITAL_COMMUNITY)
Admission: RE | Admit: 2018-08-19 | Discharge: 2018-08-19 | Disposition: A | Discharge status: Home / Self Care | Payer: Medicare HMO | Source: Ambulatory Visit | Attending: Internal Medicine | Admitting: Internal Medicine

## 2018-08-19 ENCOUNTER — Other Ambulatory Visit: Payer: Medicare HMO

## 2018-08-19 DIAGNOSIS — B20 Human immunodeficiency virus [HIV] disease: Secondary | ICD-10-CM

## 2018-08-19 DIAGNOSIS — Z113 Encounter for screening for infections with a predominantly sexual mode of transmission: Secondary | ICD-10-CM | POA: Diagnosis not present

## 2018-08-20 LAB — T-HELPER CELL (CD4) - (RCID CLINIC ONLY)
CD4 T CELL ABS: 500 /uL (ref 400–2700)
CD4 T CELL HELPER: 36 % (ref 33–55)

## 2018-08-20 LAB — URINE CYTOLOGY ANCILLARY ONLY
Chlamydia: NEGATIVE
Neisseria Gonorrhea: NEGATIVE

## 2018-08-21 LAB — CBC WITH DIFFERENTIAL/PLATELET
ABSOLUTE MONOCYTES: 535 {cells}/uL (ref 200–950)
Basophils Absolute: 42 cells/uL (ref 0–200)
Basophils Relative: 0.8 %
EOS PCT: 2.7 %
Eosinophils Absolute: 143 cells/uL (ref 15–500)
HEMATOCRIT: 42.4 % (ref 38.5–50.0)
HEMOGLOBIN: 14.2 g/dL (ref 13.2–17.1)
LYMPHS ABS: 1473 {cells}/uL (ref 850–3900)
MCH: 29.3 pg (ref 27.0–33.0)
MCHC: 33.5 g/dL (ref 32.0–36.0)
MCV: 87.4 fL (ref 80.0–100.0)
MPV: 10.5 fL (ref 7.5–12.5)
Monocytes Relative: 10.1 %
NEUTROS ABS: 3106 {cells}/uL (ref 1500–7800)
NEUTROS PCT: 58.6 %
Platelets: 226 10*3/uL (ref 140–400)
RBC: 4.85 10*6/uL (ref 4.20–5.80)
RDW: 13.7 % (ref 11.0–15.0)
Total Lymphocyte: 27.8 %
WBC: 5.3 10*3/uL (ref 3.8–10.8)

## 2018-08-21 LAB — COMPLETE METABOLIC PANEL WITH GFR
AG RATIO: 1.2 (calc) (ref 1.0–2.5)
ALKALINE PHOSPHATASE (APISO): 76 U/L (ref 40–115)
ALT: 26 U/L (ref 9–46)
AST: 38 U/L — AB (ref 10–35)
Albumin: 4.1 g/dL (ref 3.6–5.1)
BUN: 11 mg/dL (ref 7–25)
CALCIUM: 8.9 mg/dL (ref 8.6–10.3)
CHLORIDE: 103 mmol/L (ref 98–110)
CO2: 28 mmol/L (ref 20–32)
Creat: 1.06 mg/dL (ref 0.70–1.33)
GFR, EST NON AFRICAN AMERICAN: 78 mL/min/{1.73_m2} (ref >=60)
GFR, Est African American: 90 mL/min/{1.73_m2} (ref >=60)
Globulin: 3.3 g/dL (calc) (ref 1.9–3.7)
Glucose, Bld: 98 mg/dL (ref 65–99)
Potassium: 3.8 mmol/L (ref 3.5–5.3)
SODIUM: 139 mmol/L (ref 135–146)
Total Bilirubin: 0.4 mg/dL (ref 0.2–1.2)
Total Protein: 7.4 g/dL (ref 6.1–8.1)

## 2018-08-21 LAB — HIV-1 RNA QUANT-NO REFLEX-BLD
HIV 1 RNA QUANT: DETECTED {copies}/mL — AB
HIV-1 RNA Quant, Log: <1.3 Log copies/mL — AB

## 2018-08-21 LAB — RPR: RPR: NONREACTIVE

## 2018-09-02 ENCOUNTER — Encounter: Payer: Self-pay | Admitting: Internal Medicine

## 2018-09-02 ENCOUNTER — Ambulatory Visit: Payer: Medicare HMO | Admitting: Internal Medicine

## 2018-09-02 VITALS — BP 142/88 | HR 76 | Temp 98.3°F | Ht 77.0 in | Wt 289.5 lb

## 2018-09-02 DIAGNOSIS — Z113 Encounter for screening for infections with a predominantly sexual mode of transmission: Secondary | ICD-10-CM

## 2018-09-02 DIAGNOSIS — Z23 Encounter for immunization: Secondary | ICD-10-CM | POA: Diagnosis not present

## 2018-09-02 DIAGNOSIS — K0889 Other specified disorders of teeth and supporting structures: Secondary | ICD-10-CM | POA: Diagnosis not present

## 2018-09-02 DIAGNOSIS — Z5181 Encounter for therapeutic drug level monitoring: Secondary | ICD-10-CM | POA: Diagnosis not present

## 2018-09-02 DIAGNOSIS — B20 Human immunodeficiency virus [HIV] disease: Secondary | ICD-10-CM

## 2018-09-02 NOTE — Assessment & Plan Note (Signed)
Discussed Prevnar and given today 

## 2018-09-02 NOTE — Assessment & Plan Note (Signed)
Dental referral placed today for [facility]. Information to schedule appointment completed today.   

## 2018-09-02 NOTE — Progress Notes (Signed)
  Subjective:    Patient ID: Justin Joyce, male    DOB: 07-13-1961, 58 y.o.   MRN: [id number]  HPI followup of HIV for his yearly visit.  He continues on [place]. He has had no problems and no missed doses. He is pleased with the regimen. He remains suppressed with a normal CD4 count. He does see her primary physician who does his lipid management.   He follows yearly and continues to do well.  Having some dental issues and saw a dentist but cost prohibitive.   Review of Systems  Constitutional: Negative for fatigue and unexpected weight change.  HENT: Negative for sore throat and trouble swallowing.   Gastrointestinal: Negative for diarrhea.  Skin: Negative for rash.  Neurological: Negative for dizziness and headaches.       Objective:   Physical Exam  Constitutional: He appears well-developed and well-nourished. No distress.  HENT:  Mouth/Throat: Oropharynx is clear and moist. No oropharyngeal exudate.  Eyes: Right eye exhibits no discharge. Left eye exhibits no discharge. No scleral icterus.  Cardiovascular: Normal rate, regular rhythm and normal heart sounds. Exam reveals no gallop and no friction rub.  No murmur heard. Pulmonary/Chest: Effort normal and breath sounds normal. No respiratory distress. He has no wheezes.  Lymphadenopathy:    He has no cervical adenopathy.  Skin: Skin is warm and dry. No rash noted.   SH  Previous smoker       Assessment & Plan:

## 2018-09-02 NOTE — Assessment & Plan Note (Signed)
Creat, LFTs wnl.  

## 2018-09-02 NOTE — Assessment & Plan Note (Signed)
Doing well, no changes and rtc 1 year.

## 2018-09-02 NOTE — Assessment & Plan Note (Signed)
Screened negative.  Same partner long term.  No issues.

## 2018-09-03 NOTE — Addendum Note (Signed)
Addended by: Reggy Eye on: 09/03/2018 04:24 PM   Modules accepted: Orders

## 2018-09-10 ENCOUNTER — Other Ambulatory Visit: Payer: Self-pay | Admitting: Internal Medicine

## 2018-09-10 DIAGNOSIS — B20 Human immunodeficiency virus [HIV] disease: Secondary | ICD-10-CM

## 2018-09-16 DIAGNOSIS — G43909 Migraine, unspecified, not intractable, without status migrainosus: Secondary | ICD-10-CM | POA: Diagnosis not present

## 2018-09-16 DIAGNOSIS — Z0000 Encounter for general adult medical examination without abnormal findings: Secondary | ICD-10-CM | POA: Diagnosis not present

## 2018-09-16 DIAGNOSIS — N529 Male erectile dysfunction, unspecified: Secondary | ICD-10-CM | POA: Diagnosis not present

## 2018-09-16 DIAGNOSIS — J309 Allergic rhinitis, unspecified: Secondary | ICD-10-CM | POA: Diagnosis not present

## 2018-09-16 DIAGNOSIS — Z21 Asymptomatic human immunodeficiency virus [HIV] infection status: Secondary | ICD-10-CM | POA: Diagnosis not present

## 2018-09-16 DIAGNOSIS — Z1389 Encounter for screening for other disorder: Secondary | ICD-10-CM | POA: Diagnosis not present

## 2018-09-16 DIAGNOSIS — R7303 Prediabetes: Secondary | ICD-10-CM | POA: Diagnosis not present

## 2018-09-16 DIAGNOSIS — Z125 Encounter for screening for malignant neoplasm of prostate: Secondary | ICD-10-CM | POA: Diagnosis not present

## 2018-09-16 DIAGNOSIS — J449 Chronic obstructive pulmonary disease, unspecified: Secondary | ICD-10-CM | POA: Diagnosis not present

## 2018-09-16 DIAGNOSIS — F324 Major depressive disorder, single episode, in partial remission: Secondary | ICD-10-CM | POA: Diagnosis not present

## 2018-12-09 IMAGING — CT CT L SPINE W/ CM
1 of 7 series · 6 of 14 positions shown, 8 images · non-contrast
Comparison: MRI lumbar spine 01/21/2017

CLINICAL DATA: Recurrent low back pain extending to the right line.
Left L5 radiculopathy.
TECHNIQUE: Contiguous axial images were obtained through the Lumbar spine after
the intrathecal infusion of infusion. Coronal and sagittal
reconstructions were obtained of the axial image sets.

[Series 3: l spine soft · axial · 0.32mm/px · z∈[-312,-116]mm · 6 of 91 slices shown, 8 images]
[im 13/91  soft-tissue]
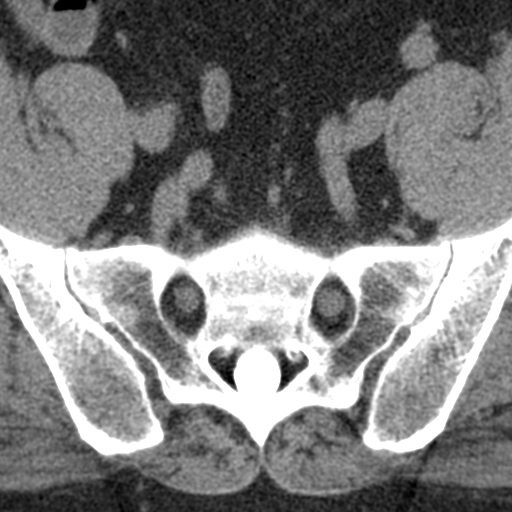
[im 13/91  bone]
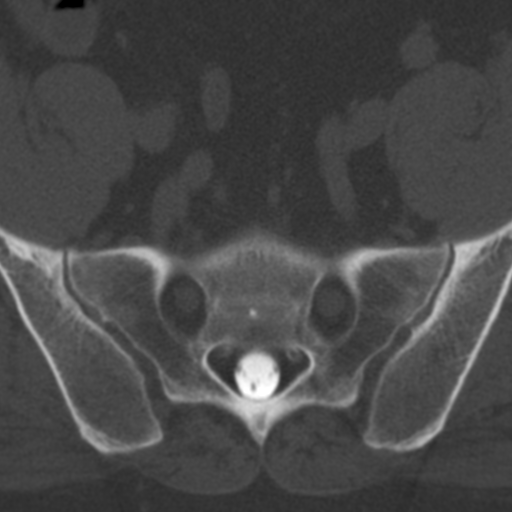
[im 26/91  bone]
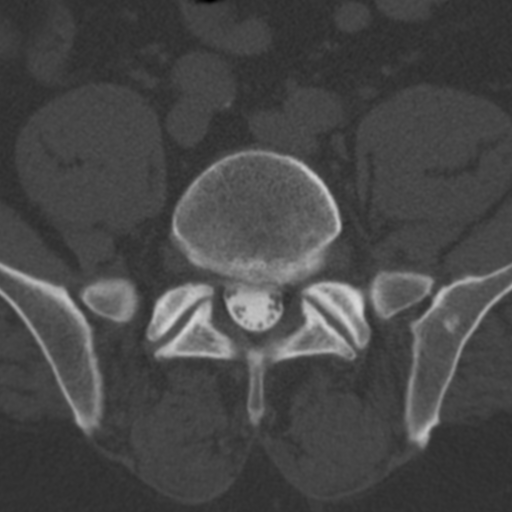
[im 39/91  bone]
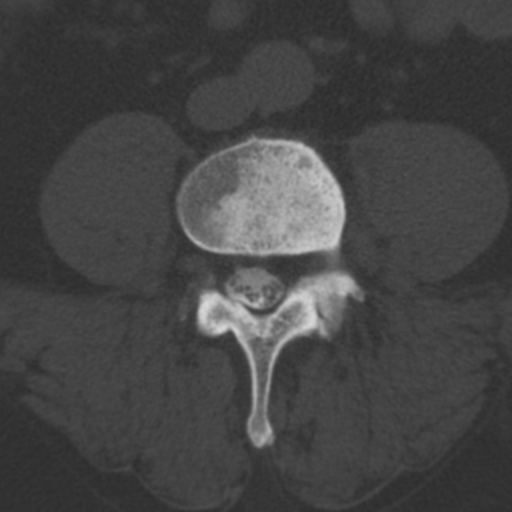
[im 52/91  bone]
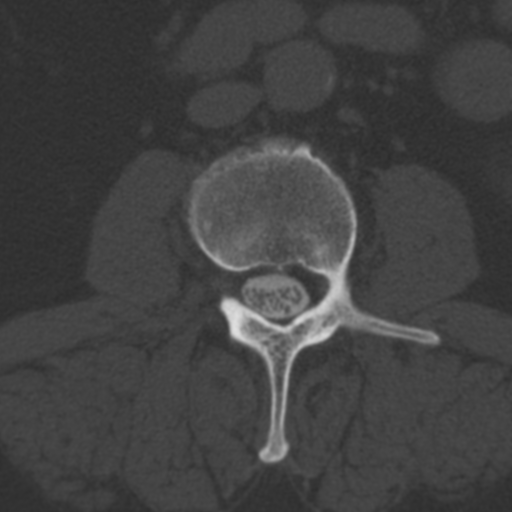
[im 65/91  soft-tissue]
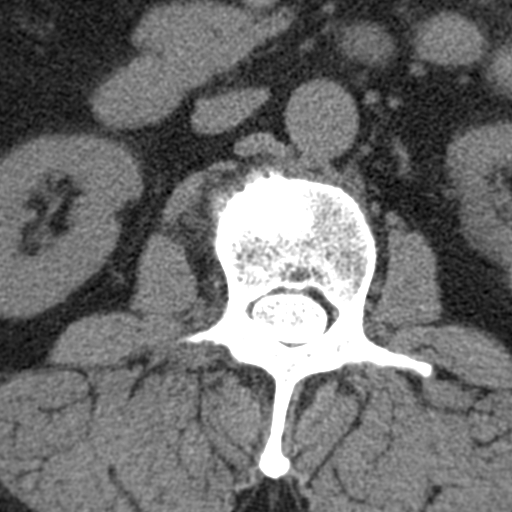
[im 65/91  bone]
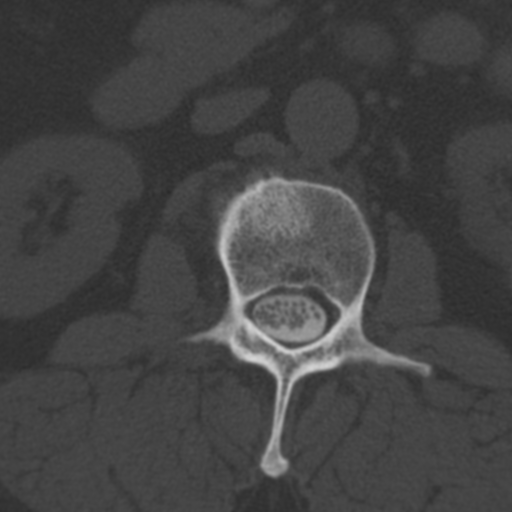
[im 78/91  bone]
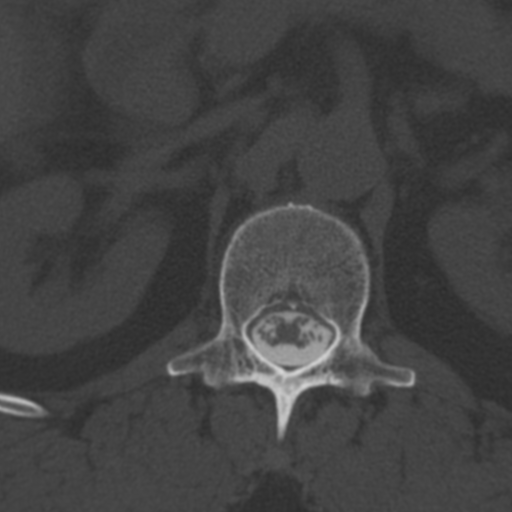

[6 of 14 positions shown; findings below may reference images not displayed]

EXAM:
LUMBAR MYELOGRAM

FLUOROSCOPY TIME:  Radiation Exposure Index (as provided by the
fluoroscopic device): Oral 4.72 uGy*m2

Fluoroscopy Time:  28 seconds

Number of Acquired Images:  0

PROCEDURE:
After thorough discussion of risks and benefits of the procedure
including bleeding, infection, injury to nerves, blood vessels,
adjacent structures as well as headache and CSF leak, written and
oral informed consent was obtained. Consent was obtained by Dr.
W-Nell Tvivo. Time out form was completed.

Patient was positioned prone on the fluoroscopy table. Local
anesthesia was provided with 1% lidocaine without epinephrine after
prepped and draped in the usual sterile fashion. Puncture was
performed at L3-4 using a 3 1/2 inch 22-gauge spinal needle via
right paramedian approach. Using a single pass through the dura, the
needle was placed within the thecal sac, with return of clear CSF.
15 mL of Isovue M 200 was injected into the thecal sac, with normal
opacification of the nerve roots and cauda equina consistent with
free flow within the subarachnoid space.

I personally performed the lumbar puncture and administered the
intrathecal contrast. I also personally supervised acquisition of
the myelogram images.
FINDINGS: LUMBAR MYELOGRAM FINDINGS:

Asymmetric endplate sclerosis is present on the right at L2-3. There
is mild right subarticular narrowing at L2-3. Slight disc bulging is
noted. Asymmetric left-sided endplate sclerotic changes are present
at L4-5. Moderate left subarticular stenosis is present at L4-5,
likely impacting the left L5 nerve roots. There is early truncation
of the left L4 nerve root as well. The S1 nerve roots fill normally
on both sides. Lumbar nerve roots fill normally on the right in the
lower lumbar spine.

Standing images demonstrate exaggeration of the disc protrusions at
L3-4 and L4-5. There is no significant change through limited range
of flexion or extension. No abnormal motion is present.

CT LUMBAR MYELOGRAM FINDINGS:

Lumbar spine is imaged from the midbody of T12 through S3-4.
Leftward curvature is present at L2-3. Rightward curvature is
centered at L4-5. Asymmetric sclerotic changes are again noted on
the concave aspect of the curvature both levels.

Limited imaging the abdomen is unremarkable. There is no significant
adenopathy. Minimal atherosclerotic changes are present.

L1-2:  Negative.

L2-3: A right paramedian disc protrusion is present. Mild right
subarticular and foraminal narrowing is present. The left foramen is
patent.

L3-4: A mild broad-based disc protrusion is present. Mild
subarticular and foraminal narrowing is present on the right. The
there slight progression since the prior study.

L4-5: A right laminectomy is present. A left paramedian disc
protrusion is present. There is calcification of the disc and
associated endplate spurring as well. This results in moderate left
subarticular stenosis. Mild left foraminal narrowing is present.
Mild right foraminal narrowing is noted as well. The right lateral
central canal is decompressed.

L5-S1: A left paramedian disc protrusion is present. Asymmetric
left-sided facet hypertrophy is noted. This results in mild left
subarticular and foraminal narrowing. The right foramen is patent.
IMPRESSION: 1. Mild right subarticular and foraminal narrowing at L2-3 and L[DATE] contribute to the right groin pain.
2. Moderate left subarticular stenosis at L4-5 secondary to a left
paramedian disc protrusion and asymmetric facet hypertrophy. This
potentially impacts the left L5 nerve root.
3. Mild foraminal narrowing bilaterally at L4-5.
4. Mild left subarticular and foraminal narrowing at L5-S1
potentially impacting the L5 and S1 nerve roots.
5. Disc protrusions at L3-4 and L4-5 are worse with standing.

## 2019-03-16 ENCOUNTER — Other Ambulatory Visit: Payer: Self-pay | Admitting: Internal Medicine

## 2019-03-16 DIAGNOSIS — B20 Human immunodeficiency virus [HIV] disease: Secondary | ICD-10-CM

## 2019-07-01 DIAGNOSIS — M79602 Pain in left arm: Secondary | ICD-10-CM | POA: Diagnosis not present

## 2019-07-02 DIAGNOSIS — M5412 Radiculopathy, cervical region: Secondary | ICD-10-CM | POA: Diagnosis not present

## 2019-07-09 DIAGNOSIS — M5412 Radiculopathy, cervical region: Secondary | ICD-10-CM | POA: Diagnosis not present

## 2019-07-13 ENCOUNTER — Other Ambulatory Visit: Payer: Self-pay | Admitting: Neurological Surgery

## 2019-07-13 DIAGNOSIS — M5412 Radiculopathy, cervical region: Secondary | ICD-10-CM

## 2019-08-01 ENCOUNTER — Other Ambulatory Visit: Payer: Medicare HMO

## 2019-08-04 ENCOUNTER — Other Ambulatory Visit: Payer: Self-pay

## 2019-08-04 ENCOUNTER — Ambulatory Visit
Admission: RE | Admit: 2019-08-04 | Discharge: 2019-08-04 | Disposition: A | Discharge status: Home / Self Care | Payer: Medicare HMO | Source: Ambulatory Visit | Attending: Neurological Surgery | Admitting: Neurological Surgery

## 2019-08-04 DIAGNOSIS — M4802 Spinal stenosis, cervical region: Secondary | ICD-10-CM | POA: Diagnosis not present

## 2019-08-04 DIAGNOSIS — M5412 Radiculopathy, cervical region: Secondary | ICD-10-CM

## 2019-08-11 DIAGNOSIS — R200 Anesthesia of skin: Secondary | ICD-10-CM | POA: Diagnosis not present

## 2019-08-11 DIAGNOSIS — M5412 Radiculopathy, cervical region: Secondary | ICD-10-CM | POA: Diagnosis not present

## 2019-08-15 ENCOUNTER — Other Ambulatory Visit: Payer: Medicare HMO

## 2019-08-19 ENCOUNTER — Other Ambulatory Visit: Payer: Self-pay

## 2019-08-19 DIAGNOSIS — M25512 Pain in left shoulder: Secondary | ICD-10-CM | POA: Diagnosis not present

## 2019-08-19 DIAGNOSIS — M5412 Radiculopathy, cervical region: Secondary | ICD-10-CM | POA: Diagnosis not present

## 2019-08-19 DIAGNOSIS — R200 Anesthesia of skin: Secondary | ICD-10-CM | POA: Diagnosis not present

## 2019-08-19 DIAGNOSIS — M542 Cervicalgia: Secondary | ICD-10-CM | POA: Diagnosis not present

## 2019-08-19 DIAGNOSIS — B20 Human immunodeficiency virus [HIV] disease: Secondary | ICD-10-CM

## 2019-08-19 MED ORDER — GENVOYA 150-150-200-10 MG PO TABS: 1.0000 | ORAL_TABLET | Freq: Every day | ORAL | 0 refills | Status: DC

## 2019-08-20 DIAGNOSIS — G5622 Lesion of ulnar nerve, left upper limb: Secondary | ICD-10-CM | POA: Diagnosis not present

## 2019-08-20 DIAGNOSIS — M5412 Radiculopathy, cervical region: Secondary | ICD-10-CM | POA: Diagnosis not present

## 2019-08-25 DIAGNOSIS — G5622 Lesion of ulnar nerve, left upper limb: Secondary | ICD-10-CM | POA: Diagnosis not present

## 2019-09-02 ENCOUNTER — Other Ambulatory Visit: Payer: Medicare HMO

## 2019-09-02 ENCOUNTER — Other Ambulatory Visit (HOSPITAL_COMMUNITY)
Admission: RE | Admit: 2019-09-02 | Discharge: 2019-09-02 | Disposition: A | Discharge status: Home / Self Care | Payer: Medicare HMO | Source: Ambulatory Visit | Attending: Internal Medicine | Admitting: Internal Medicine

## 2019-09-02 ENCOUNTER — Other Ambulatory Visit: Payer: Self-pay

## 2019-09-02 DIAGNOSIS — Z113 Encounter for screening for infections with a predominantly sexual mode of transmission: Secondary | ICD-10-CM | POA: Insufficient documentation

## 2019-09-02 DIAGNOSIS — B20 Human immunodeficiency virus [HIV] disease: Secondary | ICD-10-CM | POA: Diagnosis not present

## 2019-09-02 NOTE — Addendum Note (Signed)
Addended by: Dolan Amen D on: 09/02/2019 09:16 AM   Modules accepted: Orders

## 2019-09-03 LAB — URINE CYTOLOGY ANCILLARY ONLY
Chlamydia: NEGATIVE
Comment: NEGATIVE
Comment: NORMAL
Neisseria Gonorrhea: NEGATIVE

## 2019-09-03 LAB — T-HELPER CELL (CD4) - (RCID CLINIC ONLY)
CD4 % Helper T Cell: 33 % (ref 33–65)
CD4 T Cell Abs: 603 /uL (ref 400–1790)

## 2019-09-04 DIAGNOSIS — G5622 Lesion of ulnar nerve, left upper limb: Secondary | ICD-10-CM | POA: Diagnosis not present

## 2019-09-05 LAB — COMPLETE METABOLIC PANEL WITH GFR
AG Ratio: 1.6 (calc) (ref 1.0–2.5)
ALT: 17 U/L (ref 9–46)
AST: 16 U/L (ref 10–35)
Albumin: 4.4 g/dL (ref 3.6–5.1)
Alkaline phosphatase (APISO): 61 U/L (ref 35–144)
BUN: 19 mg/dL (ref 7–25)
CO2: 29 mmol/L (ref 20–32)
Calcium: 9.1 mg/dL (ref 8.6–10.3)
Chloride: 104 mmol/L (ref 98–110)
Creat: 1 mg/dL (ref 0.70–1.33)
GFR, Est African American: 96 mL/min/{1.73_m2} (ref >=60)
GFR, Est Non African American: 83 mL/min/{1.73_m2} (ref >=60)
Globulin: 2.8 g/dL (calc) (ref 1.9–3.7)
Glucose, Bld: 97 mg/dL (ref 65–99)
Potassium: 4.1 mmol/L (ref 3.5–5.3)
Sodium: 139 mmol/L (ref 135–146)
Total Bilirubin: 0.6 mg/dL (ref 0.2–1.2)
Total Protein: 7.2 g/dL (ref 6.1–8.1)

## 2019-09-05 LAB — CBC WITH DIFFERENTIAL/PLATELET
Absolute Monocytes: 896 cells/uL (ref 200–950)
Basophils Absolute: 31 cells/uL (ref 0–200)
Basophils Relative: 0.3 %
Eosinophils Absolute: 41 cells/uL (ref 15–500)
Eosinophils Relative: 0.4 %
HCT: 43.1 % (ref 38.5–50.0)
Hemoglobin: 14.4 g/dL (ref 13.2–17.1)
Lymphs Abs: 1844 cells/uL (ref 850–3900)
MCH: 29.9 pg (ref 27.0–33.0)
MCHC: 33.4 g/dL (ref 32.0–36.0)
MCV: 89.6 fL (ref 80.0–100.0)
MPV: 9.9 fL (ref 7.5–12.5)
Monocytes Relative: 8.7 %
Neutro Abs: 7488 cells/uL (ref 1500–7800)
Neutrophils Relative %: 72.7 %
Platelets: 268 10*3/uL (ref 140–400)
RBC: 4.81 10*6/uL (ref 4.20–5.80)
RDW: 14.9 % (ref 11.0–15.0)
Total Lymphocyte: 17.9 %
WBC: 10.3 10*3/uL (ref 3.8–10.8)

## 2019-09-05 LAB — HIV-1 RNA QUANT-NO REFLEX-BLD
HIV 1 RNA Quant: <20 copies/mL — AB
HIV-1 RNA Quant, Log: <1.3 Log copies/mL — AB

## 2019-09-05 LAB — RPR: RPR Ser Ql: NONREACTIVE

## 2019-09-13 ENCOUNTER — Other Ambulatory Visit: Payer: Self-pay | Admitting: Internal Medicine

## 2019-09-13 DIAGNOSIS — B20 Human immunodeficiency virus [HIV] disease: Secondary | ICD-10-CM

## 2019-09-16 ENCOUNTER — Ambulatory Visit: Payer: Medicare HMO | Admitting: Internal Medicine

## 2019-09-16 ENCOUNTER — Other Ambulatory Visit: Payer: Self-pay

## 2019-09-16 ENCOUNTER — Encounter: Payer: Self-pay | Admitting: Internal Medicine

## 2019-09-16 VITALS — BP 153/92 | HR 82 | Temp 98.5°F | Ht 77.0 in | Wt 270.0 lb

## 2019-09-16 DIAGNOSIS — B20 Human immunodeficiency virus [HIV] disease: Secondary | ICD-10-CM | POA: Diagnosis not present

## 2019-09-16 DIAGNOSIS — Z5181 Encounter for therapeutic drug level monitoring: Secondary | ICD-10-CM

## 2019-09-16 DIAGNOSIS — Z113 Encounter for screening for infections with a predominantly sexual mode of transmission: Secondary | ICD-10-CM | POA: Diagnosis not present

## 2019-09-16 NOTE — Assessment & Plan Note (Signed)
Screened negative.  Has not been sexually active.

## 2019-09-16 NOTE — Progress Notes (Signed)
  Subjective:    Patient ID: Justin Joyce, male    DOB: 18-May-1961, 59 y.o.   MRN: [id number]  HPI Here for follow up of HIV Continues on Genvoya with no missed doses.  No new issues.  Labs reviewed with him and all good. No new concerns.  He is interested in getting the COVID-19 vaccine. Had an impinged nerve in his left arm and underwent surgery by Dr. Ronnald Ramp.  Improving.  Sees Dr. Lysle Rubens next month.    Review of Systems  Constitutional: Negative for activity change.  Gastrointestinal: Negative for diarrhea and nausea.  Skin: Negative for rash.  Psychiatric/Behavioral: Negative for dysphoric mood.       Objective:   Physical Exam  Constitutional: He appears well-developed and well-nourished. No distress.  Eyes: No scleral icterus.  Cardiovascular: Normal rate, regular rhythm and normal heart sounds.  No murmur heard. Pulmonary/Chest: Effort normal and breath sounds normal. No respiratory distress. He has no wheezes.  Neurological: He is alert.  Skin: No rash noted.  Psychiatric: He has a normal mood and affect.   SH  Previous smoker       Assessment & Plan:

## 2019-09-16 NOTE — Patient Instructions (Signed)
COVID-19 Vaccine Information can be found at: [email or web address] For questions related to vaccine distribution or appointments, please email [email or web address] or call [phone].    

## 2019-09-16 NOTE — Assessment & Plan Note (Signed)
Creat, LFTs wnl.  

## 2019-09-16 NOTE — Assessment & Plan Note (Addendum)
He continues to do well, no concerns and rtc 1 year.  Gets his lipid panel by his PCP

## 2019-09-21 DIAGNOSIS — J449 Chronic obstructive pulmonary disease, unspecified: Secondary | ICD-10-CM | POA: Diagnosis not present

## 2019-09-21 DIAGNOSIS — Z125 Encounter for screening for malignant neoplasm of prostate: Secondary | ICD-10-CM | POA: Diagnosis not present

## 2019-09-21 DIAGNOSIS — Z0000 Encounter for general adult medical examination without abnormal findings: Secondary | ICD-10-CM | POA: Diagnosis not present

## 2019-09-21 DIAGNOSIS — Z13220 Encounter for screening for lipoid disorders: Secondary | ICD-10-CM | POA: Diagnosis not present

## 2019-09-21 DIAGNOSIS — Z23 Encounter for immunization: Secondary | ICD-10-CM | POA: Diagnosis not present

## 2019-09-21 DIAGNOSIS — Z1389 Encounter for screening for other disorder: Secondary | ICD-10-CM | POA: Diagnosis not present

## 2019-09-21 DIAGNOSIS — F324 Major depressive disorder, single episode, in partial remission: Secondary | ICD-10-CM | POA: Diagnosis not present

## 2019-09-21 DIAGNOSIS — Z21 Asymptomatic human immunodeficiency virus [HIV] infection status: Secondary | ICD-10-CM | POA: Diagnosis not present

## 2019-09-21 DIAGNOSIS — R7309 Other abnormal glucose: Secondary | ICD-10-CM | POA: Diagnosis not present

## 2019-10-22 ENCOUNTER — Encounter (HOSPITAL_COMMUNITY): Payer: Self-pay

## 2019-10-22 ENCOUNTER — Other Ambulatory Visit: Payer: Self-pay

## 2019-10-22 ENCOUNTER — Ambulatory Visit (HOSPITAL_COMMUNITY)
Admission: EM | Admit: 2019-10-22 | Discharge: 2019-10-22 | Disposition: A | Discharge status: Home / Self Care | Payer: Medicare HMO | Attending: Urgent Care | Admitting: Urgent Care

## 2019-10-22 DIAGNOSIS — B20 Human immunodeficiency virus [HIV] disease: Secondary | ICD-10-CM | POA: Insufficient documentation

## 2019-10-22 DIAGNOSIS — R350 Frequency of micturition: Secondary | ICD-10-CM | POA: Insufficient documentation

## 2019-10-22 DIAGNOSIS — N489 Disorder of penis, unspecified: Secondary | ICD-10-CM | POA: Insufficient documentation

## 2019-10-22 DIAGNOSIS — R300 Dysuria: Secondary | ICD-10-CM | POA: Insufficient documentation

## 2019-10-22 LAB — POCT URINALYSIS DIP (DEVICE)
Bilirubin Urine: NEGATIVE
Glucose, UA: NEGATIVE mg/dL
Ketones, ur: NEGATIVE mg/dL
Leukocytes,Ua: NEGATIVE
Nitrite: NEGATIVE
Protein, ur: NEGATIVE mg/dL
Specific Gravity, Urine: 1.025 (ref 1.005–1.030)
Urobilinogen, UA: 0.2 mg/dL (ref 0.0–1.0)
pH: 5.5 (ref 5.0–8.0)

## 2019-10-22 MED ORDER — NAPROXEN 375 MG PO TABS: 375.0000 mg | ORAL_TABLET | Freq: Two times a day (BID) | ORAL | 0 refills | Status: DC

## 2019-10-22 NOTE — Discharge Instructions (Signed)
Let's wait to see the test results to provide you with the right kind of treatment. This will take 24-72 hours. In the meantime, hydrate very well. Use Tylenol and naproxen for pain control.

## 2019-10-22 NOTE — ED Provider Notes (Signed)
[place]   MRN: [id number] DOB: January 20, 1961  Subjective:   Justin Joyce is a 59 y.o. male presenting for 2 day history of persistent dysuria, urinary frequency.  Patient now has redness at the tip of the penis.  He is HIV positive.  Denies fever, nausea, vomiting, belly pain, pelvic pain, hematuria, flank pain, penile discharge, history of syphilis.  Patient denies history of renal stones.  Had unprotected oral sex about 2 days ago and symptoms started thereafter.  No current facility-administered medications for this encounter.  Current Outpatient Medications:  Marland Kitchen  GENVOYA 150-150-200-10 MG TABS tablet, TAKE 1 TABLET BY MOUTH DAILY WITH FOOD, Disp: 30 tablet, Rfl: 0 .  HYDROcodone-acetaminophen (NORCO/VICODIN) 5-325 MG tablet, , Disp: , Rfl:  .  traZODone (DESYREL) 100 MG tablet, Take 150 mg by mouth at bedtime. Takes 1 and 1/2, Disp: , Rfl:    Allergies  Allergen Reactions  . Sulfa Antibiotics Hives    Past Medical History:  Diagnosis Date  . COPD (chronic obstructive pulmonary disease) ([place])   . H/O hiatal hernia   . HIV infection ([place])   . HIV positive ([place])      Past Surgical History:  Procedure Laterality Date  . COLONOSCOPY WITH PROPOFOL N/A 03/24/2013   Procedure: COLONOSCOPY WITH PROPOFOL;  Surgeon: Garlan Fair, MD;  Location: [facility];  Service: Endoscopy;  Laterality: N/A;  . LAMINECTOMY AND MICRODISCECTOMY LUMBAR SPINE  March 2012    Family History  Problem Relation Age of Onset  . Diabetes Father   . Diabetes Sister     Social History   Tobacco Use  . Smoking status: Former Smoker    Packs/day: 0.25    Years: 30.00    Pack years: 7.50    Types: Cigarettes  . Smokeless tobacco: Never Used  Substance Use Topics  . Alcohol use: Yes    Alcohol/week: 0.0 standard drinks    Comment: occassionally  . Drug use: No    ROS   Objective:   Vitals: BP 131/85 (BP Location: Left Arm)   Pulse 77   Temp 98.3 F (36.8 C) (Oral)   Resp  16   SpO2 100%   Physical Exam Constitutional:      General: He is not in acute distress.    Appearance: Normal appearance. He is well-developed. He is obese. He is not ill-appearing, toxic-appearing or diaphoretic.  HENT:     Head: Normocephalic and atraumatic.     Right Ear: External ear normal.     Left Ear: External ear normal.     Nose: Nose normal.     Mouth/Throat:     Pharynx: Oropharynx is clear.  Eyes:     General: No scleral icterus.       Right eye: No discharge.        Left eye: No discharge.     Extraocular Movements: Extraocular movements intact.     Pupils: Pupils are equal, round, and reactive to light.  Cardiovascular:     Rate and Rhythm: Normal rate.  Pulmonary:     Effort: Pulmonary effort is normal.  Genitourinary:    Penis: Uncircumcised. Erythema (meatus) and tenderness (meatus with slight erythema) present. No phimosis, paraphimosis, hypospadias, discharge, swelling or lesions.      Testes:        Right: Mass, tenderness or swelling not present. Right testis is descended.        Left: Mass, tenderness or swelling not present. Left testis is descended.  Musculoskeletal:     Cervical back: Normal range of motion.  Neurological:     Mental Status: He is alert and oriented to person, place, and time.  Psychiatric:        Mood and Affect: Mood normal.        Behavior: Behavior normal.        Thought Content: Thought content normal.        Judgment: Judgment normal.     Results for orders placed or performed during the hospital encounter of 10/22/19 (from the past 24 hour(s))  POCT urinalysis dip (device)     Status: Abnormal   Collection Time: 10/22/19  1:36 PM  Result Value Ref Range   Glucose, UA NEGATIVE NEGATIVE mg/dL   Bilirubin Urine NEGATIVE NEGATIVE   Ketones, ur NEGATIVE NEGATIVE mg/dL   Specific Gravity, Urine 1.025 1.005 - 1.030   Hgb urine dipstick TRACE (A) NEGATIVE   pH 5.5 5.0 - 8.0   Protein, ur NEGATIVE NEGATIVE mg/dL    Urobilinogen, UA 0.2 0.0 - 1.0 mg/dL   Nitrite NEGATIVE NEGATIVE   Leukocytes,Ua NEGATIVE NEGATIVE    Assessment and Plan :   1. Dysuria   2. Urinary frequency   3. HIV disease ([place])   4. Penile abnormality     Will hold off on treatments until results are in. Labs pending. Patient can use naproxen for pain control for now. Hydrate well, cut back on urinary irritants. Counseled patient on potential for adverse effects with medications prescribed/recommended today, ER and return-to-clinic precautions discussed, patient verbalized understanding.    Jaynee Eagles, PA-C 10/22/19 1701

## 2019-10-22 NOTE — ED Triage Notes (Signed)
Pt present penile irration with burning sensation while urinating. Pt states symptoms started over a week ago. Pt also has a red spot on the tip of his penis .

## 2019-10-23 LAB — CYTOLOGY, (ORAL, ANAL, URETHRAL) ANCILLARY ONLY
Chlamydia: NEGATIVE
Comment: NEGATIVE
Comment: NEGATIVE
Comment: NORMAL
Neisseria Gonorrhea: NEGATIVE
Trichomonas: NEGATIVE

## 2019-10-23 LAB — URINE CULTURE: Culture: <10000 — AB

## 2019-10-23 LAB — RPR: RPR Ser Ql: NONREACTIVE

## 2019-10-29 ENCOUNTER — Other Ambulatory Visit: Payer: Self-pay

## 2019-10-29 ENCOUNTER — Ambulatory Visit (HOSPITAL_COMMUNITY)
Admission: EM | Admit: 2019-10-29 | Discharge: 2019-10-29 | Disposition: A | Discharge status: Home / Self Care | Payer: Medicare HMO | Attending: Family Medicine | Admitting: Family Medicine

## 2019-10-29 DIAGNOSIS — N481 Balanitis: Secondary | ICD-10-CM | POA: Diagnosis not present

## 2019-10-29 MED ORDER — CLOTRIMAZOLE 1 % EX CREA: TOPICAL_CREAM | CUTANEOUS | 0 refills | Status: DC

## 2019-10-29 NOTE — ED Triage Notes (Signed)
Pt c/o ongoing rash to penis area since 04/01. States was evaluated here and given Rx of Naproxen; rash has not improved. Denies abdom pain, n/v/d, fever, chills.

## 2019-10-29 NOTE — ED Provider Notes (Signed)
[facility]    CSN: [id number] Arrival date & time: 10/29/19  1417      History   Chief Complaint Chief Complaint  Patient presents with  . Rash    HPI Justin Joyce is a 59 y.o. male history of HIV, COPD, presenting today for evaluation of penile rash.  Patient notes that over the past week he has had some redness to the tip of his penis.  He has had some mild associated irritation with it.  He was seen here a few days ago and had negative work-up of urine.  He was treated with Naprosyn for pain.  He denies any change to the rash, mainly just persistent redness.  Follows up regularly with infectious disease.  Has had prior syphilis and has fully treated for this.  Denies any new partners since the Covid pandemic over the past year.  Patient is uncircumcised.  Denies difficulty retracting foreskin.  Denies testicular pain or swelling.  HPI  Past Medical History:  Diagnosis Date  . COPD (chronic obstructive pulmonary disease) ([place])   . H/O hiatal hernia   . HIV infection ([place])   . HIV positive [facility])     Patient Active Problem List   Diagnosis Date Noted  . Pain, dental 09/02/2018  . Medication monitoring encounter 09/04/2017  . Screening examination for venereal disease 04/20/2014  . Bursitis of knee 05/09/2011  . CHRONIC OBSTRUCTIVE PULMONARY DISEASE 08/14/2010  . DEGENERATIVE JOINT DISEASE, BACK 08/14/2010  . SCOLIOSIS ASSOCIATED WITH OTHER CONDITION 08/14/2010  . LUNG NODULE 06/25/2007  . Human immunodeficiency virus (HIV) disease ([place]) 09/20/2006  . HERPES ZOSTER 09/20/2006  . Syphilis, unspecified 09/20/2006  . MIGRAINE 09/20/2006  . PERIPHERAL NEUROPATHY, IDIOPATHIC 09/20/2006  . DUODENITIS 09/20/2006  . HIATAL HERNIA 09/20/2006  . BACK PAIN WITH RADICULOPATHY 09/20/2006  . Tuberculin test reaction 09/20/2006    Past Surgical History:  Procedure Laterality Date  . COLONOSCOPY WITH PROPOFOL N/A 03/24/2013   Procedure: COLONOSCOPY WITH PROPOFOL;   Surgeon: Garlan Fair, MD;  [facility];  Service: Endoscopy;  Laterality: N/A;  . LAMINECTOMY AND MICRODISCECTOMY LUMBAR SPINE  March 2012       Home Medications    Prior to Admission medications   Medication Sig Start Date End Date Taking? Authorizing Provider  GENVOYA 150-150-200-10 MG TABS tablet TAKE 1 TABLET BY MOUTH DAILY WITH FOOD 09/14/19  Yes Comer, Okey Regal, MD  naproxen (NAPROSYN) 375 MG tablet Take 1 tablet (375 mg total) by mouth 2 (two) times daily with a meal. 10/22/19  Yes Jaynee Eagles, PA-C  traZODone (DESYREL) 100 MG tablet Take 150 mg by mouth at bedtime. Takes 1 and 1/2   Yes [provider]  clotrimazole (LOTRIMIN) 1 % cream Apply to affected area 2 times daily for the next 1-2 weeks 10/29/19   Wieters, [place] C, PA-C  HYDROcodone-acetaminophen (NORCO/VICODIN) 5-325 MG tablet  09/04/19   [provider]    Family History Family History  Problem Relation Age of Onset  . Diabetes Father   . Diabetes Sister     Social History Social History   Tobacco Use  . Smoking status: Former Smoker    Packs/day: 0.25    Years: 30.00    Pack years: 7.50    Types: Cigarettes  . Smokeless tobacco: Never Used  Substance Use Topics  . Alcohol use: Yes    Alcohol/week: 0.0 standard drinks    Comment: occassionally  . Drug use: No  Allergies   Sulfa antibiotics   Review of Systems Review of Systems  Constitutional: Negative for fever.  HENT: Negative for sore throat.   Respiratory: Negative for shortness of breath.   Cardiovascular: Negative for chest pain.  Gastrointestinal: Negative for abdominal pain, nausea and vomiting.  Genitourinary: Negative for difficulty urinating, discharge, dysuria, frequency, penile pain, penile swelling, scrotal swelling and testicular pain.  Skin: Positive for color change and rash.  Neurological: Negative for dizziness, light-headedness and headaches.     Physical Exam Triage Vital Signs ED  Triage Vitals  Enc Vitals Group     BP 10/29/19 1510 126/77     Pulse Rate 10/29/19 1510 79     Resp 10/29/19 1510 18     Temp 10/29/19 1510 98.2 F (36.8 C)     Temp Source 10/29/19 1510 Oral     SpO2 10/29/19 1510 100 %     Weight --      Height --      Head Circumference --      Peak Flow --      Pain Score 10/29/19 1509 0     Pain Loc --      Pain Edu? --      Excl. in [place]? --    No data found.  Updated Vital Signs BP 126/77 (BP Location: Right Arm)   Pulse 79   Temp 98.2 F (36.8 C) (Oral)   Resp 18   SpO2 100%   Visual Acuity Right Eye Distance:   Left Eye Distance:   Bilateral Distance:    Right Eye Near:   Left Eye Near:    Bilateral Near:     Physical Exam Vitals and nursing note reviewed.  Constitutional:      Appearance: He is well-developed.     Comments: No acute distress  HENT:     Head: Normocephalic and atraumatic.     Nose: Nose normal.  Eyes:     Conjunctiva/sclera: Conjunctivae normal.  Cardiovascular:     Rate and Rhythm: Normal rate.  Pulmonary:     Effort: Pulmonary effort is normal. No respiratory distress.  Abdominal:     General: There is no distension.  Genitourinary:    Comments: Uncircumcised, glans of penis with erythema noted, most prominently on left side of urethral meatus, no specific papular, vesicular, blister or ulcerative lesion noted, foreskin fully retractable  No other rash noted Musculoskeletal:        General: Normal range of motion.     Cervical back: Neck supple.  Skin:    General: Skin is warm and dry.  Neurological:     Mental Status: He is alert and oriented to person, place, and time.      UC Treatments / Results  Labs (all labs ordered are listed, but only abnormal results are displayed) Labs Reviewed - No data to display  EKG   Radiology No results found.  Procedures Procedures (including critical care time)  Medications Ordered in UC Medications - No data to display  Initial  Impression / Assessment and Plan / UC Course  I have reviewed the triage vital signs and the nursing notes.  Pertinent labs & imaging results that were available during my care of the patient were reviewed by me and considered in my medical decision making (see chart for details).     Exam suggestive of possible balanitis, possibly secondary to yeast.  Will provide clotrimazole to apply topically over the next 1 to 2 weeks.  Prior urine culture negative for UTI.  Denies concern for STDs at this time. Discussed strict return precautions. Patient verbalized understanding and is agreeable with plan.   Final Clinical Impressions(s) / UC Diagnoses   Final diagnoses:  Balanitis     Discharge Instructions     Please apply clotrimazole cream twice daily to tip of penis, keep area clean and dry, let air out when at home  Please follow-up if rash spreading, worsening, developing any ulcers or blisters or more pain/urination issues   ED Prescriptions    Medication Sig Dispense Auth. Provider   clotrimazole (LOTRIMIN) 1 % cream Apply to affected area 2 times daily for the next 1-2 weeks 28 g Wieters, [place] C, PA-C     PDMP not reviewed this encounter.   Janith Lima, [place] 10/29/19 1542

## 2019-10-29 NOTE — Discharge Instructions (Signed)
Please apply clotrimazole cream twice daily to tip of penis, keep area clean and dry, let air out when at home  Please follow-up if rash spreading, worsening, developing any ulcers or blisters or more pain/urination issues

## 2019-11-01 ENCOUNTER — Other Ambulatory Visit: Payer: Self-pay | Admitting: Internal Medicine

## 2019-11-01 DIAGNOSIS — B20 Human immunodeficiency virus [HIV] disease: Secondary | ICD-10-CM

## 2019-12-23 ENCOUNTER — Telehealth: Payer: Self-pay | Admitting: Pharmacy Technician

## 2019-12-23 NOTE — Telephone Encounter (Signed)
[facility] Patient Advocate Encounter  Filled out and Dr. Scharlene Gloss signed the needed Diagnosis Form for patient to continue getting copay assistance through [facility] [place].  I faxed it to [facility] today.  Venida Jarvis. Nadara Mustard Amanda Patient [facility] Phone: [phone] Fax:  [phone]

## 2019-12-29 DIAGNOSIS — H40003 Preglaucoma, unspecified, bilateral: Secondary | ICD-10-CM | POA: Diagnosis not present

## 2019-12-29 DIAGNOSIS — H524 Presbyopia: Secondary | ICD-10-CM | POA: Diagnosis not present

## 2020-01-05 DIAGNOSIS — H401231 Low-tension glaucoma, bilateral, mild stage: Secondary | ICD-10-CM | POA: Diagnosis not present

## 2020-01-15 DIAGNOSIS — H2513 Age-related nuclear cataract, bilateral: Secondary | ICD-10-CM | POA: Diagnosis not present

## 2020-01-15 DIAGNOSIS — H401131 Primary open-angle glaucoma, bilateral, mild stage: Secondary | ICD-10-CM | POA: Diagnosis not present

## 2020-02-19 DIAGNOSIS — H2513 Age-related nuclear cataract, bilateral: Secondary | ICD-10-CM | POA: Diagnosis not present

## 2020-02-19 DIAGNOSIS — H401131 Primary open-angle glaucoma, bilateral, mild stage: Secondary | ICD-10-CM | POA: Diagnosis not present

## 2020-08-02 ENCOUNTER — Other Ambulatory Visit (HOSPITAL_COMMUNITY)
Admission: RE | Admit: 2020-08-02 | Discharge: 2020-08-02 | Disposition: A | Discharge status: Home / Self Care | Payer: Medicare HMO | Source: Ambulatory Visit | Attending: Internal Medicine | Admitting: Internal Medicine

## 2020-08-02 ENCOUNTER — Other Ambulatory Visit: Payer: Medicare HMO

## 2020-08-02 ENCOUNTER — Other Ambulatory Visit: Payer: Self-pay

## 2020-08-02 DIAGNOSIS — Z113 Encounter for screening for infections with a predominantly sexual mode of transmission: Secondary | ICD-10-CM

## 2020-08-02 DIAGNOSIS — B20 Human immunodeficiency virus [HIV] disease: Secondary | ICD-10-CM

## 2020-08-03 LAB — T-HELPER CELL (CD4) - (RCID CLINIC ONLY)
CD4 % Helper T Cell: 32 % — ABNORMAL LOW (ref 33–65)
CD4 T Cell Abs: 573 /uL (ref 400–1790)

## 2020-08-04 LAB — URINE CYTOLOGY ANCILLARY ONLY
Chlamydia: NEGATIVE
Comment: NEGATIVE
Comment: NORMAL
Neisseria Gonorrhea: NEGATIVE

## 2020-08-07 ENCOUNTER — Other Ambulatory Visit: Payer: Self-pay | Admitting: Internal Medicine

## 2020-08-07 DIAGNOSIS — B20 Human immunodeficiency virus [HIV] disease: Secondary | ICD-10-CM

## 2020-08-08 LAB — COMPLETE METABOLIC PANEL WITH GFR
AG Ratio: 1.4 (calc) (ref 1.0–2.5)
ALT: 18 U/L (ref 9–46)
AST: 21 U/L (ref 10–35)
Albumin: 4.4 g/dL (ref 3.6–5.1)
Alkaline phosphatase (APISO): 70 U/L (ref 35–144)
BUN: 12 mg/dL (ref 7–25)
CO2: 30 mmol/L (ref 20–32)
Calcium: 9.9 mg/dL (ref 8.6–10.3)
Chloride: 102 mmol/L (ref 98–110)
Creat: 1.07 mg/dL (ref 0.70–1.33)
GFR, Est African American: 88 mL/min/{1.73_m2} (ref >=60)
GFR, Est Non African American: 76 mL/min/{1.73_m2} (ref >=60)
Globulin: 3.2 g/dL (calc) (ref 1.9–3.7)
Glucose, Bld: 119 mg/dL — ABNORMAL HIGH (ref 65–99)
Potassium: 4.3 mmol/L (ref 3.5–5.3)
Sodium: 140 mmol/L (ref 135–146)
Total Bilirubin: 0.5 mg/dL (ref 0.2–1.2)
Total Protein: 7.6 g/dL (ref 6.1–8.1)

## 2020-08-08 LAB — CBC
HCT: 46.4 % (ref 38.5–50.0)
Hemoglobin: 15.4 g/dL (ref 13.2–17.1)
MCH: 29.6 pg (ref 27.0–33.0)
MCHC: 33.2 g/dL (ref 32.0–36.0)
MCV: 89.2 fL (ref 80.0–100.0)
MPV: 10.5 fL (ref 7.5–12.5)
Platelets: 238 10*3/uL (ref 140–400)
RBC: 5.2 10*6/uL (ref 4.20–5.80)
RDW: 14 % (ref 11.0–15.0)
WBC: 5.8 10*3/uL (ref 3.8–10.8)

## 2020-08-08 LAB — RPR: RPR Ser Ql: NONREACTIVE

## 2020-08-08 LAB — HIV-1 RNA QUANT-NO REFLEX-BLD
HIV 1 RNA Quant: 66 Copies/mL — ABNORMAL HIGH
HIV-1 RNA Quant, Log: 1.82 Log cps/mL — ABNORMAL HIGH

## 2020-08-16 ENCOUNTER — Other Ambulatory Visit: Payer: Self-pay

## 2020-08-16 ENCOUNTER — Ambulatory Visit: Payer: Medicare HMO | Admitting: Internal Medicine

## 2020-08-16 ENCOUNTER — Encounter: Payer: Self-pay | Admitting: Internal Medicine

## 2020-08-16 VITALS — BP 142/88 | HR 79 | Temp 98.0°F | Wt 290.8 lb

## 2020-08-16 DIAGNOSIS — Z5181 Encounter for therapeutic drug level monitoring: Secondary | ICD-10-CM | POA: Diagnosis not present

## 2020-08-16 DIAGNOSIS — B20 Human immunodeficiency virus [HIV] disease: Secondary | ICD-10-CM

## 2020-08-16 DIAGNOSIS — Z23 Encounter for immunization: Secondary | ICD-10-CM

## 2020-08-16 DIAGNOSIS — Z113 Encounter for screening for infections with a predominantly sexual mode of transmission: Secondary | ICD-10-CM | POA: Diagnosis not present

## 2020-08-16 NOTE — Progress Notes (Signed)
   Subjective:    Patient ID: Justin Joyce, male    DOB: August 15, 1960, 60 y.o.   MRN: [id number]  HPI He is here for his yearly follow up of HIV He continues on Genvoya with no missed doses.  He has had no issues getting, taking or tolerating the medication.  His CD4 is 573 and viral load just 66 copies.  His sugar is 119 and this is monitored by Dr. Lysle Rubens.  Lipid panel monitored by Dr. Lysle Rubens as well.  He has completed his COVID-19 vaccine series with 3 doses.   3 children and 9 grandchildren all doing well.    Review of Systems  Constitutional: Negative for unexpected weight change.  Gastrointestinal: Negative for diarrhea and nausea.  Skin: Negative for rash.       Objective:   Physical Exam Eyes:     General: No scleral icterus. Cardiovascular:     Rate and Rhythm: Normal rate and regular rhythm.     Heart sounds: No murmur heard.   Pulmonary:     Effort: Pulmonary effort is normal.  Neurological:     General: No focal deficit present.     Mental Status: He is alert.  Psychiatric:        Mood and Affect: Mood normal.   SH: no tobacco        Assessment & Plan:

## 2020-08-16 NOTE — Assessment & Plan Note (Signed)
Screened negative 

## 2020-08-16 NOTE — Assessment & Plan Note (Signed)
He continues to do well and no concerns.  He will continue with yearly follow up

## 2020-08-16 NOTE — Assessment & Plan Note (Signed)
Creat, LFTs wnl.  

## 2020-09-25 ENCOUNTER — Other Ambulatory Visit: Payer: Self-pay | Admitting: Internal Medicine

## 2020-09-25 DIAGNOSIS — B20 Human immunodeficiency virus [HIV] disease: Secondary | ICD-10-CM

## 2020-09-27 DIAGNOSIS — G43909 Migraine, unspecified, not intractable, without status migrainosus: Secondary | ICD-10-CM | POA: Diagnosis not present

## 2020-09-27 DIAGNOSIS — M519 Unspecified thoracic, thoracolumbar and lumbosacral intervertebral disc disorder: Secondary | ICD-10-CM | POA: Diagnosis not present

## 2020-09-27 DIAGNOSIS — F324 Major depressive disorder, single episode, in partial remission: Secondary | ICD-10-CM | POA: Diagnosis not present

## 2020-09-27 DIAGNOSIS — I10 Essential (primary) hypertension: Secondary | ICD-10-CM | POA: Diagnosis not present

## 2020-09-27 DIAGNOSIS — R7309 Other abnormal glucose: Secondary | ICD-10-CM | POA: Diagnosis not present

## 2020-09-27 DIAGNOSIS — Z0000 Encounter for general adult medical examination without abnormal findings: Secondary | ICD-10-CM | POA: Diagnosis not present

## 2020-09-27 DIAGNOSIS — Z125 Encounter for screening for malignant neoplasm of prostate: Secondary | ICD-10-CM | POA: Diagnosis not present

## 2020-09-27 DIAGNOSIS — J309 Allergic rhinitis, unspecified: Secondary | ICD-10-CM | POA: Diagnosis not present

## 2020-09-27 DIAGNOSIS — F330 Major depressive disorder, recurrent, mild: Secondary | ICD-10-CM | POA: Diagnosis not present

## 2020-09-27 DIAGNOSIS — R7303 Prediabetes: Secondary | ICD-10-CM | POA: Diagnosis not present

## 2020-09-27 DIAGNOSIS — Z21 Asymptomatic human immunodeficiency virus [HIV] infection status: Secondary | ICD-10-CM | POA: Diagnosis not present

## 2020-09-27 DIAGNOSIS — J449 Chronic obstructive pulmonary disease, unspecified: Secondary | ICD-10-CM | POA: Diagnosis not present

## 2020-11-18 DIAGNOSIS — I10 Essential (primary) hypertension: Secondary | ICD-10-CM | POA: Diagnosis not present

## 2021-03-12 ENCOUNTER — Other Ambulatory Visit: Payer: Self-pay | Admitting: Internal Medicine

## 2021-03-12 DIAGNOSIS — B20 Human immunodeficiency virus [HIV] disease: Secondary | ICD-10-CM

## 2021-03-31 DIAGNOSIS — H04123 Dry eye syndrome of bilateral lacrimal glands: Secondary | ICD-10-CM | POA: Diagnosis not present

## 2021-03-31 DIAGNOSIS — H401131 Primary open-angle glaucoma, bilateral, mild stage: Secondary | ICD-10-CM | POA: Diagnosis not present

## 2021-03-31 DIAGNOSIS — H2513 Age-related nuclear cataract, bilateral: Secondary | ICD-10-CM | POA: Diagnosis not present

## 2021-08-23 ENCOUNTER — Other Ambulatory Visit: Payer: Self-pay

## 2021-08-23 ENCOUNTER — Other Ambulatory Visit (HOSPITAL_COMMUNITY)
Admission: RE | Admit: 2021-08-23 | Discharge: 2021-08-23 | Disposition: A | Discharge status: Home / Self Care | Payer: Medicare HMO | Source: Ambulatory Visit | Attending: Internal Medicine | Admitting: Internal Medicine

## 2021-08-23 ENCOUNTER — Other Ambulatory Visit: Payer: Medicare HMO

## 2021-08-23 DIAGNOSIS — B20 Human immunodeficiency virus [HIV] disease: Secondary | ICD-10-CM

## 2021-08-23 DIAGNOSIS — Z113 Encounter for screening for infections with a predominantly sexual mode of transmission: Secondary | ICD-10-CM

## 2021-08-23 DIAGNOSIS — Z79899 Other long term (current) drug therapy: Secondary | ICD-10-CM

## 2021-08-24 LAB — URINE CYTOLOGY ANCILLARY ONLY
Chlamydia: NEGATIVE
Comment: NEGATIVE
Comment: NORMAL
Neisseria Gonorrhea: NEGATIVE

## 2021-08-24 LAB — T-HELPER CELL (CD4) - (RCID CLINIC ONLY)
CD4 % Helper T Cell: 33 % (ref 33–65)
CD4 T Cell Abs: 517 /uL (ref 400–1790)

## 2021-08-26 LAB — COMPLETE METABOLIC PANEL WITH GFR
AG Ratio: 1.4 (calc) (ref 1.0–2.5)
ALT: 21 U/L (ref 9–46)
AST: 21 U/L (ref 10–35)
Albumin: 4.5 g/dL (ref 3.6–5.1)
Alkaline phosphatase (APISO): 81 U/L (ref 35–144)
BUN: 15 mg/dL (ref 7–25)
CO2: 27 mmol/L (ref 20–32)
Calcium: 9.4 mg/dL (ref 8.6–10.3)
Chloride: 104 mmol/L (ref 98–110)
Creat: 1 mg/dL (ref 0.70–1.30)
Globulin: 3.3 g/dL (calc) (ref 1.9–3.7)
Glucose, Bld: 102 mg/dL — ABNORMAL HIGH (ref 65–99)
Potassium: 4.3 mmol/L (ref 3.5–5.3)
Sodium: 139 mmol/L (ref 135–146)
Total Bilirubin: 0.6 mg/dL (ref 0.2–1.2)
Total Protein: 7.8 g/dL (ref 6.1–8.1)
eGFR: 87 mL/min/{1.73_m2} (ref >=60)

## 2021-08-26 LAB — CBC WITH DIFFERENTIAL/PLATELET
Absolute Monocytes: 522 cells/uL (ref 200–950)
Basophils Absolute: 41 cells/uL (ref 0–200)
Basophils Relative: 0.7 %
Eosinophils Absolute: 122 cells/uL (ref 15–500)
Eosinophils Relative: 2.1 %
HCT: 46.3 % (ref 38.5–50.0)
Hemoglobin: 15.3 g/dL (ref 13.2–17.1)
Lymphs Abs: 1723 cells/uL (ref 850–3900)
MCH: 29.4 pg (ref 27.0–33.0)
MCHC: 33 g/dL (ref 32.0–36.0)
MCV: 88.9 fL (ref 80.0–100.0)
MPV: 10.3 fL (ref 7.5–12.5)
Monocytes Relative: 9 %
Neutro Abs: 3393 cells/uL (ref 1500–7800)
Neutrophils Relative %: 58.5 %
Platelets: 240 10*3/uL (ref 140–400)
RBC: 5.21 10*6/uL (ref 4.20–5.80)
RDW: 14 % (ref 11.0–15.0)
Total Lymphocyte: 29.7 %
WBC: 5.8 10*3/uL (ref 3.8–10.8)

## 2021-08-26 LAB — LIPID PANEL
Cholesterol: 162 mg/dL (ref <200)
HDL: 46 mg/dL (ref >=40)
LDL Cholesterol (Calc): 97 mg/dL (calc)
Non-HDL Cholesterol (Calc): 116 mg/dL (calc) (ref <130)
Total CHOL/HDL Ratio: 3.5 (calc) (ref <5.0)
Triglycerides: 95 mg/dL (ref <150)

## 2021-08-26 LAB — HIV-1 RNA QUANT-NO REFLEX-BLD
HIV 1 RNA Quant: NOT DETECTED Copies/mL
HIV-1 RNA Quant, Log: NOT DETECTED Log cps/mL

## 2021-08-26 LAB — RPR: RPR Ser Ql: NONREACTIVE

## 2021-08-30 ENCOUNTER — Other Ambulatory Visit: Payer: Self-pay | Admitting: Internal Medicine

## 2021-08-30 DIAGNOSIS — B20 Human immunodeficiency virus [HIV] disease: Secondary | ICD-10-CM

## 2021-08-30 NOTE — Telephone Encounter (Signed)
Patient has appointment on 2/15

## 2021-08-31 ENCOUNTER — Other Ambulatory Visit: Payer: Self-pay | Admitting: Internal Medicine

## 2021-08-31 ENCOUNTER — Ambulatory Visit
Admission: RE | Admit: 2021-08-31 | Discharge: 2021-08-31 | Disposition: A | Discharge status: Home / Self Care | Payer: Medicare HMO | Source: Ambulatory Visit | Attending: Internal Medicine | Admitting: Internal Medicine

## 2021-08-31 DIAGNOSIS — R1032 Left lower quadrant pain: Secondary | ICD-10-CM

## 2021-08-31 DIAGNOSIS — M5416 Radiculopathy, lumbar region: Secondary | ICD-10-CM | POA: Diagnosis not present

## 2021-08-31 DIAGNOSIS — M1612 Unilateral primary osteoarthritis, left hip: Secondary | ICD-10-CM | POA: Diagnosis not present

## 2021-08-31 DIAGNOSIS — R1030 Lower abdominal pain, unspecified: Secondary | ICD-10-CM | POA: Diagnosis not present

## 2021-09-06 ENCOUNTER — Other Ambulatory Visit: Payer: Self-pay

## 2021-09-06 ENCOUNTER — Encounter: Payer: Self-pay | Admitting: Internal Medicine

## 2021-09-06 ENCOUNTER — Ambulatory Visit: Payer: Medicare HMO | Admitting: Internal Medicine

## 2021-09-06 VITALS — Resp 16 | Ht 76.0 in | Wt 292.0 lb

## 2021-09-06 DIAGNOSIS — Z5181 Encounter for therapeutic drug level monitoring: Secondary | ICD-10-CM | POA: Diagnosis not present

## 2021-09-06 DIAGNOSIS — B20 Human immunodeficiency virus [HIV] disease: Secondary | ICD-10-CM

## 2021-09-06 DIAGNOSIS — Z113 Encounter for screening for infections with a predominantly sexual mode of transmission: Secondary | ICD-10-CM | POA: Diagnosis not present

## 2021-09-06 DIAGNOSIS — Z79899 Other long term (current) drug therapy: Secondary | ICD-10-CM | POA: Diagnosis not present

## 2021-09-06 DIAGNOSIS — Z23 Encounter for immunization: Secondary | ICD-10-CM | POA: Diagnosis not present

## 2021-09-06 MED ORDER — GENVOYA 150-150-200-10 MG PO TABS: 1.0000 | ORAL_TABLET | Freq: Every day | ORAL | 11 refills | Status: DC

## 2021-09-06 NOTE — Assessment & Plan Note (Signed)
He continues to do well on Biktarvy and no issues.  No changes and follow up in 1 year.

## 2021-09-06 NOTE — Assessment & Plan Note (Signed)
Creat, LFTs wnl.  

## 2021-09-06 NOTE — Assessment & Plan Note (Signed)
Screened negative 

## 2021-09-06 NOTE — Progress Notes (Signed)
° °  Subjective:    Patient ID: Justin Joyce, male    DOB: [birth year], 61 y.o.   MRN: [id number]  HPI Here for follow up of HIV He continues on Genvoya and denies any missed doses.  He has had no issues with getting, taking or tolerating his medication.  CD4 of 517 and viral load not detected.  He is having back issues and numbness of his left leg and following with neurosurgery for that.     Review of Systems  Constitutional:  Negative for fatigue.  Gastrointestinal:  Negative for diarrhea and nausea.  Skin:  Negative for rash.      Objective:   Physical Exam Eyes:     General: No scleral icterus. Pulmonary:     Effort: Pulmonary effort is normal.  Skin:    Findings: No rash.  Neurological:     General: No focal deficit present.     Mental Status: He is alert.  Psychiatric:        Mood and Affect: Mood normal.   SH: no tobacco       Assessment & Plan:

## 2021-09-06 NOTE — Assessment & Plan Note (Signed)
Lipid panel noted and no concerns.

## 2021-09-07 DIAGNOSIS — M5416 Radiculopathy, lumbar region: Secondary | ICD-10-CM | POA: Diagnosis not present

## 2021-09-08 ENCOUNTER — Other Ambulatory Visit: Payer: Self-pay | Admitting: Neurological Surgery

## 2021-09-08 DIAGNOSIS — M5416 Radiculopathy, lumbar region: Secondary | ICD-10-CM

## 2021-09-18 ENCOUNTER — Ambulatory Visit (INDEPENDENT_AMBULATORY_CARE_PROVIDER_SITE_OTHER): Payer: Medicare HMO

## 2021-09-18 ENCOUNTER — Other Ambulatory Visit: Payer: Self-pay

## 2021-09-18 ENCOUNTER — Ambulatory Visit: Payer: Medicare HMO | Admitting: Orthopaedic Surgery

## 2021-09-18 VITALS — Ht 76.0 in | Wt 292.0 lb

## 2021-09-18 DIAGNOSIS — M1612 Unilateral primary osteoarthritis, left hip: Secondary | ICD-10-CM

## 2021-09-18 DIAGNOSIS — M25552 Pain in left hip: Secondary | ICD-10-CM

## 2021-09-18 MED ORDER — MELOXICAM 15 MG PO TABS: 15.0000 mg | ORAL_TABLET | Freq: Every day | ORAL | 3 refills | Status: DC | PRN

## 2021-09-18 NOTE — Progress Notes (Signed)
Office Visit Note   Patient: Justin Joyce           Date of Birth: 1961/01/28           MRN: [id number] Visit Date: 09/18/2021              Requested by: Wenda Low, MD [facility] [employer] [address],  [place] [address] PCP: Wenda Low, MD   Assessment & Plan: Visit Diagnoses:  1. Pain in left hip   2. Unilateral primary osteoarthritis, left hip     Plan: I spoke to the patient in length in detail about his left hip.  He does have some femoral acetabular impingement which is developing into mild to moderate arthritis.  He wants to stay conservative as to for now.  I have recommended at least a one-time intra-articular steroid injection of the left hip to see how it helps from a therapeutic and diagnostic standpoint.  He agrees with this treatment plan.  We will work on getting this injection scheduled and we will see him back in 4 weeks to see how he is doing overall.  All questions and concerns were answered and addressed.  Follow-Up Instructions: Return in about 4 weeks (around 10/16/2021).   Orders:  Orders Placed This Encounter  Procedures   XR Pelvis 1-2 Views   No orders of the defined types were placed in this encounter.     Procedures: No procedures performed   Clinical Data: No additional findings.   Subjective: Chief Complaint  Patient presents with   Left Hip - Pain  The patient is a very pleasant 61 year old gentleman sent from Dr. Deforest Hoyles to evaluate and treat left hip pain.  He has been having pain in the groin for about 4 months now with no known injury.  He does occasionally walk with a limp and has been starting to definitely affect his mobility and his quality of life.  He is able to put his shoes and socks on easily, but there is some pain with that activity.  He denies any change in bowel bladder function.  He denies being a diabetic.  He is immunocompromised but under good control and treatment.  HPI  Review of  Systems   Objective: Vital Signs: Ht 6\' 4"  (1.93 m)    Wt 292 lb (132.5 kg)    BMI 35.54 kg/m   Physical Exam He is alert and orient x3 and in no acute distress Ortho Exam Examination of his left hip shows it does move smoothly and fluidly but there is definitely pain in the groin with internal and external rotation and some slight stiffness with rotation.  His right hip exam is entirely normal. Specialty Comments:  No specialty comments available.  Imaging: XR Pelvis 1-2 Views  Result Date: 09/18/2021 Standing AP pelvis and lateral of the left hip does show superior lateral joint space narrowing and signs of femoral acetabular impingement.  The right hip appears normal.  There is no evidence of AVN on plain films.    PMFS History: Patient Active Problem List   Diagnosis Date Noted   Unilateral primary osteoarthritis, left hip 09/18/2021   Encounter for long-term (current) use of high-risk medication 09/06/2021   Pain, dental 09/02/2018   Medication monitoring encounter 09/04/2017   Screening examination for venereal disease 04/20/2014   Bursitis of knee 05/09/2011   CHRONIC OBSTRUCTIVE PULMONARY DISEASE 08/14/2010   LUNG NODULE 06/25/2007   Human immunodeficiency virus (HIV) disease ([place]) 09/20/2006  HERPES ZOSTER 09/20/2006   Syphilis, unspecified 09/20/2006   MIGRAINE 09/20/2006   DUODENITIS 09/20/2006   HIATAL HERNIA 09/20/2006   Tuberculin test reaction 09/20/2006   Past Medical History:  Diagnosis Date   COPD (chronic obstructive pulmonary disease) ([place])    H/O hiatal hernia    HIV infection ([place])    HIV positive ([place])     Family History  Problem Relation Age of Onset   Diabetes Father    Diabetes Sister     Past Surgical History:  Procedure Laterality Date   COLONOSCOPY WITH PROPOFOL N/A 03/24/2013   Procedure: COLONOSCOPY WITH PROPOFOL;  Surgeon: Garlan Fair, MD;  [facility];  Service: Endoscopy;  Laterality: N/A;   LAMINECTOMY AND  MICRODISCECTOMY LUMBAR SPINE  March 2012   Social History   Occupational History   Not on file  Tobacco Use   Smoking status: Former    Packs/day: 0.25    Years: 30.00    Pack years: 7.50    Types: Cigarettes   Smokeless tobacco: Never  Substance and Sexual Activity   Alcohol use: Yes    Alcohol/week: 0.0 standard drinks    Comment: occassionally   Drug use: No   Sexual activity: Not Currently    Partners: Male    Comment: pt. given condoms

## 2021-09-18 NOTE — Addendum Note (Signed)
Addended by: Jean Rosenthal on: 09/18/2021 08:37 AM   Modules accepted: Orders

## 2021-09-28 ENCOUNTER — Ambulatory Visit (INDEPENDENT_AMBULATORY_CARE_PROVIDER_SITE_OTHER): Payer: Medicare HMO | Admitting: Physical Medicine and Rehabilitation

## 2021-09-28 ENCOUNTER — Encounter: Payer: Self-pay | Admitting: Physical Medicine and Rehabilitation

## 2021-09-28 ENCOUNTER — Other Ambulatory Visit: Payer: Self-pay

## 2021-09-28 ENCOUNTER — Ambulatory Visit: Payer: Self-pay

## 2021-09-28 DIAGNOSIS — M25552 Pain in left hip: Secondary | ICD-10-CM

## 2021-09-28 NOTE — Progress Notes (Signed)
Pt state left hip pain. Pt state getting up from out of bed or an sitting position makes the pain worse. Pt state he takes pain meds to help ease his pain. ? ?Numeric Pain Rating Scale and Functional Assessment ?Average Pain 8 ? ? ?In the last MONTH (on 0-10 scale) has pain interfered with the following? ? ?1. General activity like being  able to carry out your everyday physical activities such as walking, climbing stairs, carrying groceries, or moving a chair?  ?Rating(10) ? ? ? -BT, -Dye Allergies. ? ?

## 2021-10-01 ENCOUNTER — Ambulatory Visit
Admission: RE | Admit: 2021-10-01 | Discharge: 2021-10-01 | Disposition: A | Discharge status: Home / Self Care | Payer: Medicare HMO | Source: Ambulatory Visit | Attending: Neurological Surgery | Admitting: Neurological Surgery

## 2021-10-01 DIAGNOSIS — R200 Anesthesia of skin: Secondary | ICD-10-CM | POA: Diagnosis not present

## 2021-10-01 DIAGNOSIS — M5137 Other intervertebral disc degeneration, lumbosacral region: Secondary | ICD-10-CM | POA: Diagnosis not present

## 2021-10-01 DIAGNOSIS — M48061 Spinal stenosis, lumbar region without neurogenic claudication: Secondary | ICD-10-CM | POA: Diagnosis not present

## 2021-10-01 DIAGNOSIS — M5416 Radiculopathy, lumbar region: Secondary | ICD-10-CM

## 2021-10-01 DIAGNOSIS — M5126 Other intervertebral disc displacement, lumbar region: Secondary | ICD-10-CM | POA: Diagnosis not present

## 2021-10-01 MED ORDER — GADOBENATE DIMEGLUMINE 529 MG/ML IV SOLN
20.0000 mL | Freq: Once | INTRAVENOUS | Status: AC | PRN
  Administered 2021-10-01: 20 mL via INTRAVENOUS

## 2021-10-07 MED ORDER — BUPIVACAINE HCL 0.25 % IJ SOLN
4.0000 mL | INTRAMUSCULAR | Status: AC | PRN
  Administered 2021-09-28: 4 mL via INTRA_ARTICULAR

## 2021-10-07 MED ORDER — TRIAMCINOLONE ACETONIDE 40 MG/ML IJ SUSP
60.0000 mg | INTRAMUSCULAR | Status: AC | PRN
  Administered 2021-09-28: 60 mg via INTRA_ARTICULAR

## 2021-10-07 NOTE — Progress Notes (Signed)
? ?  Justin Joyce - 61 y.o. male MRN [id number]  Date of birth: [birth year] ? ?Office Visit Note: ?Visit Date: 09/28/2021 ?PCP: Wenda Low, MD ?Referred by: Mcarthur Rossetti* ? ?Subjective: ?Chief Complaint  ?Patient presents with  ? Left Hip - Pain  ? ?HPI:  Justin Joyce is a 61 y.o. male who comes in today at the request of Dr. Jean Rosenthal for planned Left anesthetic hip arthrogram with fluoroscopic guidance.  The patient has failed conservative care including home exercise, medications, time and activity modification.  This injection will be diagnostic and hopefully therapeutic.  Please see requesting physician notes for further details and justification. ? ?ROS Otherwise per HPI. ? ?Assessment & Plan: ?Visit Diagnoses:  ?  ICD-10-CM   ?1. Pain in left hip  M25.552 XR C-ARM NO REPORT  ?  ?  ?Plan: No additional findings.  ? ?Meds & Orders: No orders of the defined types were placed in this encounter. ?  ?Orders Placed This Encounter  ?Procedures  ? Large Joint Inj  ? XR C-ARM NO REPORT  ?  ?Follow-up: Return for visit to requesting provider as needed.  ? ?Procedures: ?Large Joint Inj: L hip joint on 09/28/2021 2:44 PM ?Indications: diagnostic evaluation and pain ?Details: 22 G 3.5 in needle, fluoroscopy-guided anterior approach ? ?Arthrogram: No ? ?Medications: 4 mL bupivacaine 0.25 %; 60 mg triamcinolone acetonide 40 MG/ML ?Outcome: tolerated well, no immediate complications ? ?There was excellent flow of contrast producing a partial arthrogram of the hip. The patient did have relief of symptoms during the anesthetic phase of the injection. ?Procedure, treatment alternatives, risks and benefits explained, specific risks discussed. Consent was given by the patient. Immediately prior to procedure a time out was called to verify the correct patient, procedure, equipment, support staff and site/side marked as required. Patient was prepped and draped in the usual sterile fashion.  ? ?  ?    ? ?Clinical History: ?No specialty comments available.  ? ? ? ?Objective:  VS:  HT:    WT:   BMI:     BP:   HR: bpm  TEMP: ( )  RESP:  ?Physical Exam  ? ?Imaging: ?No results found. ?

## 2021-10-16 ENCOUNTER — Ambulatory Visit (INDEPENDENT_AMBULATORY_CARE_PROVIDER_SITE_OTHER): Payer: Medicare HMO | Admitting: Orthopaedic Surgery

## 2021-10-16 ENCOUNTER — Encounter: Payer: Self-pay | Admitting: Orthopaedic Surgery

## 2021-10-16 DIAGNOSIS — M1612 Unilateral primary osteoarthritis, left hip: Secondary | ICD-10-CM | POA: Diagnosis not present

## 2021-10-16 DIAGNOSIS — M25552 Pain in left hip: Secondary | ICD-10-CM | POA: Diagnosis not present

## 2021-10-16 NOTE — Progress Notes (Signed)
The patient comes in for follow-up after Dr. Ernestina Patches provided an intra-articular steroid injection in his left hip joint.  He says for the first day or 2 was very painful but since then his hip is doing much better and the injection was very helpful for him.  He is 61 years old.  He will turn 61 a day of the week.  He is also followed by Dr. Ronnald Ramp for his lumbar spine and he has follow-up with Dr. Ronnald Ramp tomorrow to go over MRI of the lumbar spine. ? ?Examination his left hip does show some stiffness with internal and external rotation but not severe.  He denies any pain in the groin when I manipulate his hip. ? ?This point follow-up can be as needed.  He understands if things worsen not to hesitate to give [place] a call.  All question concerns were answered and addressed.  He needs to wait at least 5 to 6 months between any type of steroid injection in that hip joint. ?

## 2021-10-17 DIAGNOSIS — Z6834 Body mass index (BMI) 34.0-34.9, adult: Secondary | ICD-10-CM | POA: Diagnosis not present

## 2021-10-17 DIAGNOSIS — R030 Elevated blood-pressure reading, without diagnosis of hypertension: Secondary | ICD-10-CM | POA: Diagnosis not present

## 2021-10-17 DIAGNOSIS — M5416 Radiculopathy, lumbar region: Secondary | ICD-10-CM | POA: Diagnosis not present

## 2021-11-06 DIAGNOSIS — R7303 Prediabetes: Secondary | ICD-10-CM | POA: Diagnosis not present

## 2021-11-06 DIAGNOSIS — Z0000 Encounter for general adult medical examination without abnormal findings: Secondary | ICD-10-CM | POA: Diagnosis not present

## 2021-11-06 DIAGNOSIS — Z21 Asymptomatic human immunodeficiency virus [HIV] infection status: Secondary | ICD-10-CM | POA: Diagnosis not present

## 2021-11-06 DIAGNOSIS — Z1389 Encounter for screening for other disorder: Secondary | ICD-10-CM | POA: Diagnosis not present

## 2021-11-06 DIAGNOSIS — F330 Major depressive disorder, recurrent, mild: Secondary | ICD-10-CM | POA: Diagnosis not present

## 2021-11-06 DIAGNOSIS — M519 Unspecified thoracic, thoracolumbar and lumbosacral intervertebral disc disorder: Secondary | ICD-10-CM | POA: Diagnosis not present

## 2021-11-06 DIAGNOSIS — J449 Chronic obstructive pulmonary disease, unspecified: Secondary | ICD-10-CM | POA: Diagnosis not present

## 2021-11-06 DIAGNOSIS — Z125 Encounter for screening for malignant neoplasm of prostate: Secondary | ICD-10-CM | POA: Diagnosis not present

## 2021-11-06 DIAGNOSIS — N529 Male erectile dysfunction, unspecified: Secondary | ICD-10-CM | POA: Diagnosis not present

## 2021-11-06 DIAGNOSIS — I10 Essential (primary) hypertension: Secondary | ICD-10-CM | POA: Diagnosis not present

## 2021-11-14 DIAGNOSIS — M5416 Radiculopathy, lumbar region: Secondary | ICD-10-CM | POA: Diagnosis not present

## 2021-12-13 ENCOUNTER — Other Ambulatory Visit: Payer: Self-pay | Admitting: Orthopaedic Surgery

## 2021-12-13 DIAGNOSIS — M5416 Radiculopathy, lumbar region: Secondary | ICD-10-CM | POA: Diagnosis not present

## 2022-01-24 DIAGNOSIS — M5416 Radiculopathy, lumbar region: Secondary | ICD-10-CM | POA: Diagnosis not present

## 2022-02-19 ENCOUNTER — Telehealth: Payer: Self-pay

## 2022-02-19 NOTE — Telephone Encounter (Signed)
Located Copay Relief Forms in Dr Francene Boyers box in triage been there since 02/06/22. Informed CPR caller and patient that I have forms in hand and will make sure they are signed and faxed today. Terri Piedra signed them and I faxed at 3:19 pm to fax number [phone].I placed original form and conf. in Scanning and placed copy in my file in pod 4.

## 2022-04-16 DIAGNOSIS — H2513 Age-related nuclear cataract, bilateral: Secondary | ICD-10-CM | POA: Diagnosis not present

## 2022-04-16 DIAGNOSIS — H401131 Primary open-angle glaucoma, bilateral, mild stage: Secondary | ICD-10-CM | POA: Diagnosis not present

## 2022-04-16 DIAGNOSIS — H04123 Dry eye syndrome of bilateral lacrimal glands: Secondary | ICD-10-CM | POA: Diagnosis not present

## 2022-04-19 ENCOUNTER — Other Ambulatory Visit: Payer: Self-pay | Admitting: Orthopaedic Surgery

## 2022-04-24 DIAGNOSIS — Z6834 Body mass index (BMI) 34.0-34.9, adult: Secondary | ICD-10-CM | POA: Diagnosis not present

## 2022-04-24 DIAGNOSIS — M5416 Radiculopathy, lumbar region: Secondary | ICD-10-CM | POA: Diagnosis not present

## 2022-06-07 DIAGNOSIS — D100 Benign neoplasm of lip: Secondary | ICD-10-CM | POA: Diagnosis not present

## 2022-07-08 ENCOUNTER — Other Ambulatory Visit: Payer: Self-pay | Admitting: Internal Medicine

## 2022-07-08 DIAGNOSIS — B20 Human immunodeficiency virus [HIV] disease: Secondary | ICD-10-CM

## 2022-08-27 ENCOUNTER — Other Ambulatory Visit: Payer: Medicare HMO

## 2022-08-27 ENCOUNTER — Other Ambulatory Visit: Payer: Self-pay

## 2022-08-27 DIAGNOSIS — Z79899 Other long term (current) drug therapy: Secondary | ICD-10-CM

## 2022-08-27 DIAGNOSIS — B20 Human immunodeficiency virus [HIV] disease: Secondary | ICD-10-CM | POA: Diagnosis not present

## 2022-08-27 DIAGNOSIS — Z113 Encounter for screening for infections with a predominantly sexual mode of transmission: Secondary | ICD-10-CM

## 2022-08-29 LAB — CBC
HCT: 44.6 % (ref 38.5–50.0)
Hemoglobin: 15.1 g/dL (ref 13.2–17.1)
MCH: 30.1 pg (ref 27.0–33.0)
MCHC: 33.9 g/dL (ref 32.0–36.0)
MCV: 88.8 fL (ref 80.0–100.0)
MPV: 9.8 fL (ref 7.5–12.5)
Platelets: 244 10*3/uL (ref 140–400)
RBC: 5.02 10*6/uL (ref 4.20–5.80)
RDW: 13.8 % (ref 11.0–15.0)
WBC: 5.8 10*3/uL (ref 3.8–10.8)

## 2022-08-29 LAB — RPR: RPR Ser Ql: NONREACTIVE

## 2022-08-29 LAB — COMPLETE METABOLIC PANEL WITH GFR
AG Ratio: 1.4 (calc) (ref 1.0–2.5)
ALT: 15 U/L (ref 9–46)
AST: 18 U/L (ref 10–35)
Albumin: 4.5 g/dL (ref 3.6–5.1)
Alkaline phosphatase (APISO): 77 U/L (ref 35–144)
BUN: 10 mg/dL (ref 7–25)
CO2: 30 mmol/L (ref 20–32)
Calcium: 9.4 mg/dL (ref 8.6–10.3)
Chloride: 105 mmol/L (ref 98–110)
Creat: 0.95 mg/dL (ref 0.70–1.35)
Globulin: 3.3 g/dL (calc) (ref 1.9–3.7)
Glucose, Bld: 96 mg/dL (ref 65–99)
Potassium: 4.1 mmol/L (ref 3.5–5.3)
Sodium: 140 mmol/L (ref 135–146)
Total Bilirubin: 0.4 mg/dL (ref 0.2–1.2)
Total Protein: 7.8 g/dL (ref 6.1–8.1)
eGFR: 92 mL/min/{1.73_m2} (ref >=60)

## 2022-08-29 LAB — LIPID PANEL
Cholesterol: 179 mg/dL (ref <200)
HDL: 52 mg/dL (ref >=40)
LDL Cholesterol (Calc): 107 mg/dL (calc) — ABNORMAL HIGH
Non-HDL Cholesterol (Calc): 127 mg/dL (calc) (ref <130)
Total CHOL/HDL Ratio: 3.4 (calc) (ref <5.0)
Triglycerides: 107 mg/dL (ref <150)

## 2022-08-29 LAB — HIV-1 RNA QUANT-NO REFLEX-BLD
HIV 1 RNA Quant: 46 Copies/mL — ABNORMAL HIGH
HIV-1 RNA Quant, Log: 1.66 Log cps/mL — ABNORMAL HIGH

## 2022-08-29 LAB — T-HELPER CELLS (CD4) COUNT (NOT AT ARMC)
Absolute CD4: 617 cells/uL (ref 490–1740)
CD4 T Helper %: 32 % (ref 30–61)
Total lymphocyte count: 1943 cells/uL (ref 850–3900)

## 2022-09-12 ENCOUNTER — Encounter: Payer: Self-pay | Admitting: Internal Medicine

## 2022-09-12 ENCOUNTER — Ambulatory Visit: Payer: Medicare HMO | Admitting: Internal Medicine

## 2022-09-12 ENCOUNTER — Other Ambulatory Visit: Payer: Self-pay

## 2022-09-12 VITALS — BP 156/83 | HR 61 | Temp 97.6°F | Ht 77.0 in | Wt 280.0 lb

## 2022-09-12 DIAGNOSIS — Z5181 Encounter for therapeutic drug level monitoring: Secondary | ICD-10-CM

## 2022-09-12 DIAGNOSIS — Z113 Encounter for screening for infections with a predominantly sexual mode of transmission: Secondary | ICD-10-CM

## 2022-09-12 DIAGNOSIS — B20 Human immunodeficiency virus [HIV] disease: Secondary | ICD-10-CM | POA: Diagnosis not present

## 2022-09-12 DIAGNOSIS — Z79899 Other long term (current) drug therapy: Secondary | ICD-10-CM | POA: Diagnosis not present

## 2022-09-12 MED ORDER — GENVOYA 150-150-200-10 MG PO TABS: 1.0000 | ORAL_TABLET | Freq: Every day | ORAL | 11 refills | Status: DC

## 2022-09-12 NOTE — Assessment & Plan Note (Signed)
He continues to do very well.  Labs reviewed with him and a small viral load blip noted, nothing of concern.  Will refill for the year  I did discuss the [employer] study with him and offered to start a statin.  He will discuss with Dr. Lysle Rubens.    With new research coming out by way of the [place] study regarding cardiovascular event risk reduction for PLWH, I discussed that over the 8 year trial initiation of statin (pitavastatin) therapy was shown to reduce CV disease by 35% independent of other risk factors.   The 10-year ASCVD risk score (Arnett DK, et al., 2019) is: 18.2%   Values used to calculate the score:     Age: 62 years     Sex: Male     Is Non-Hispanic African American: Yes     Diabetic: No     Tobacco smoker: No     Systolic Blood Pressure: [id number] mmHg     Is BP treated: Yes     HDL Cholesterol: 52 mg/dL     Total Cholesterol: 179 mg/dL  We discussed the patient's 10-year CVD Risk Score to be at least moderately elevated, and would benefit from intervention given HIV+ and > 17 yo.

## 2022-09-12 NOTE — Assessment & Plan Note (Signed)
Screened negative, low risk.

## 2022-09-12 NOTE — Progress Notes (Signed)
   Subjective:    Patient ID: Justin Joyce, male    DOB: 1961-05-21, 62 y.o.   MRN: [id number]  HPI Justin Joyce is here for follow up of HIV He continues on Genvoya and denies any missed doses.  No issues in the year.  Continues to follow with Dr. Lysle Rubens for primary care.  Had a lot of dental work done and much better now.     Review of Systems  Constitutional:  Negative for fatigue.  Gastrointestinal:  Negative for diarrhea.  Skin:  Negative for rash.       Objective:   Physical Exam Constitutional:      Appearance: Normal appearance.  Eyes:     General: No scleral icterus. Pulmonary:     Effort: Pulmonary effort is normal.  Skin:    Findings: No rash.  Neurological:     General: No focal deficit present.  Psychiatric:        Mood and Affect: Mood normal.    SH: no tobacco       Assessment & Plan:

## 2022-09-12 NOTE — Assessment & Plan Note (Signed)
Lipid panel reviewed with him

## 2022-11-07 DIAGNOSIS — Z6832 Body mass index (BMI) 32.0-32.9, adult: Secondary | ICD-10-CM | POA: Diagnosis not present

## 2022-11-07 DIAGNOSIS — M5416 Radiculopathy, lumbar region: Secondary | ICD-10-CM | POA: Diagnosis not present

## 2022-11-08 DIAGNOSIS — Z125 Encounter for screening for malignant neoplasm of prostate: Secondary | ICD-10-CM | POA: Diagnosis not present

## 2022-11-08 DIAGNOSIS — Z21 Asymptomatic human immunodeficiency virus [HIV] infection status: Secondary | ICD-10-CM | POA: Diagnosis not present

## 2022-11-08 DIAGNOSIS — E785 Hyperlipidemia, unspecified: Secondary | ICD-10-CM | POA: Diagnosis not present

## 2022-11-08 DIAGNOSIS — R7303 Prediabetes: Secondary | ICD-10-CM | POA: Diagnosis not present

## 2022-11-08 DIAGNOSIS — J449 Chronic obstructive pulmonary disease, unspecified: Secondary | ICD-10-CM | POA: Diagnosis not present

## 2022-11-08 DIAGNOSIS — M519 Unspecified thoracic, thoracolumbar and lumbosacral intervertebral disc disorder: Secondary | ICD-10-CM | POA: Diagnosis not present

## 2022-11-08 DIAGNOSIS — Z0000 Encounter for general adult medical examination without abnormal findings: Secondary | ICD-10-CM | POA: Diagnosis not present

## 2022-11-08 DIAGNOSIS — F330 Major depressive disorder, recurrent, mild: Secondary | ICD-10-CM | POA: Diagnosis not present

## 2022-11-08 DIAGNOSIS — Z1331 Encounter for screening for depression: Secondary | ICD-10-CM | POA: Diagnosis not present

## 2022-11-08 DIAGNOSIS — I10 Essential (primary) hypertension: Secondary | ICD-10-CM | POA: Diagnosis not present

## 2023-01-09 DIAGNOSIS — E785 Hyperlipidemia, unspecified: Secondary | ICD-10-CM | POA: Diagnosis not present

## 2023-01-09 DIAGNOSIS — I10 Essential (primary) hypertension: Secondary | ICD-10-CM | POA: Diagnosis not present

## 2023-03-26 DIAGNOSIS — Z1211 Encounter for screening for malignant neoplasm of colon: Secondary | ICD-10-CM | POA: Diagnosis not present

## 2023-03-26 DIAGNOSIS — K635 Polyp of colon: Secondary | ICD-10-CM | POA: Diagnosis not present

## 2023-03-26 DIAGNOSIS — K589 Irritable bowel syndrome without diarrhea: Secondary | ICD-10-CM | POA: Diagnosis not present

## 2023-03-26 DIAGNOSIS — D128 Benign neoplasm of rectum: Secondary | ICD-10-CM | POA: Diagnosis not present

## 2023-03-29 DIAGNOSIS — K635 Polyp of colon: Secondary | ICD-10-CM | POA: Diagnosis not present

## 2023-03-29 DIAGNOSIS — D128 Benign neoplasm of rectum: Secondary | ICD-10-CM | POA: Diagnosis not present

## 2023-04-22 DIAGNOSIS — H401131 Primary open-angle glaucoma, bilateral, mild stage: Secondary | ICD-10-CM | POA: Diagnosis not present

## 2023-04-22 DIAGNOSIS — H2513 Age-related nuclear cataract, bilateral: Secondary | ICD-10-CM | POA: Diagnosis not present

## 2023-04-22 DIAGNOSIS — H04123 Dry eye syndrome of bilateral lacrimal glands: Secondary | ICD-10-CM | POA: Diagnosis not present

## 2023-05-06 DIAGNOSIS — M5416 Radiculopathy, lumbar region: Secondary | ICD-10-CM | POA: Diagnosis not present

## 2023-06-12 DIAGNOSIS — R7303 Prediabetes: Secondary | ICD-10-CM | POA: Diagnosis not present

## 2023-06-12 DIAGNOSIS — I10 Essential (primary) hypertension: Secondary | ICD-10-CM | POA: Diagnosis not present

## 2023-06-12 DIAGNOSIS — J449 Chronic obstructive pulmonary disease, unspecified: Secondary | ICD-10-CM | POA: Diagnosis not present

## 2023-06-12 DIAGNOSIS — E785 Hyperlipidemia, unspecified: Secondary | ICD-10-CM | POA: Diagnosis not present

## 2023-06-12 DIAGNOSIS — E669 Obesity, unspecified: Secondary | ICD-10-CM | POA: Diagnosis not present

## 2023-06-12 DIAGNOSIS — F324 Major depressive disorder, single episode, in partial remission: Secondary | ICD-10-CM | POA: Diagnosis not present

## 2023-06-12 DIAGNOSIS — B20 Human immunodeficiency virus [HIV] disease: Secondary | ICD-10-CM | POA: Diagnosis not present

## 2023-07-29 ENCOUNTER — Other Ambulatory Visit: Payer: Self-pay | Admitting: Internal Medicine

## 2023-07-29 DIAGNOSIS — B20 Human immunodeficiency virus [HIV] disease: Secondary | ICD-10-CM

## 2023-08-28 ENCOUNTER — Other Ambulatory Visit (HOSPITAL_COMMUNITY)
Admission: RE | Admit: 2023-08-28 | Discharge: 2023-08-28 | Disposition: A | Discharge status: Home / Self Care | Payer: Medicare HMO | Source: Ambulatory Visit | Attending: Internal Medicine | Admitting: Internal Medicine

## 2023-08-28 ENCOUNTER — Other Ambulatory Visit: Payer: Self-pay

## 2023-08-28 ENCOUNTER — Other Ambulatory Visit: Payer: Medicare HMO

## 2023-08-28 DIAGNOSIS — Z113 Encounter for screening for infections with a predominantly sexual mode of transmission: Secondary | ICD-10-CM | POA: Insufficient documentation

## 2023-08-28 DIAGNOSIS — B20 Human immunodeficiency virus [HIV] disease: Secondary | ICD-10-CM | POA: Diagnosis not present

## 2023-08-28 DIAGNOSIS — Z79899 Other long term (current) drug therapy: Secondary | ICD-10-CM

## 2023-08-29 LAB — URINE CYTOLOGY ANCILLARY ONLY
Chlamydia: NEGATIVE
Comment: NEGATIVE
Comment: NORMAL
Neisseria Gonorrhea: NEGATIVE

## 2023-08-29 LAB — T-HELPER CELL (CD4) - (RCID CLINIC ONLY)
CD4 % Helper T Cell: 36 % (ref 33–65)
CD4 T Cell Abs: 506 /uL (ref 400–1790)

## 2023-08-30 LAB — CBC WITH DIFFERENTIAL/PLATELET
Absolute Lymphocytes: 1394 {cells}/uL (ref 850–3900)
Absolute Monocytes: 466 {cells}/uL (ref 200–950)
Basophils Absolute: 42 {cells}/uL (ref 0–200)
Basophils Relative: 0.8 %
Eosinophils Absolute: 80 {cells}/uL (ref 15–500)
Eosinophils Relative: 1.5 %
HCT: 44.8 % (ref 38.5–50.0)
Hemoglobin: 14.6 g/dL (ref 13.2–17.1)
MCH: 29.6 pg (ref 27.0–33.0)
MCHC: 32.6 g/dL (ref 32.0–36.0)
MCV: 90.9 fL (ref 80.0–100.0)
MPV: 9.9 fL (ref 7.5–12.5)
Monocytes Relative: 8.8 %
Neutro Abs: 3318 {cells}/uL (ref 1500–7800)
Neutrophils Relative %: 62.6 %
Platelets: 229 10*3/uL (ref 140–400)
RBC: 4.93 10*6/uL (ref 4.20–5.80)
RDW: 13.8 % (ref 11.0–15.0)
Total Lymphocyte: 26.3 %
WBC: 5.3 10*3/uL (ref 3.8–10.8)

## 2023-08-30 LAB — LIPID PANEL
Cholesterol: 115 mg/dL (ref <200)
HDL: 52 mg/dL (ref >=40)
LDL Cholesterol (Calc): 44 mg/dL
Non-HDL Cholesterol (Calc): 63 mg/dL (ref <130)
Total CHOL/HDL Ratio: 2.2 (calc) (ref <5.0)
Triglycerides: 100 mg/dL (ref <150)

## 2023-08-30 LAB — COMPLETE METABOLIC PANEL WITH GFR
AG Ratio: 1.6 (calc) (ref 1.0–2.5)
ALT: 19 U/L (ref 9–46)
AST: 24 U/L (ref 10–35)
Albumin: 4.7 g/dL (ref 3.6–5.1)
Alkaline phosphatase (APISO): 79 U/L (ref 35–144)
BUN: 10 mg/dL (ref 7–25)
CO2: 30 mmol/L (ref 20–32)
Calcium: 9.5 mg/dL (ref 8.6–10.3)
Chloride: 103 mmol/L (ref 98–110)
Creat: 0.88 mg/dL (ref 0.70–1.35)
Globulin: 3 g/dL (ref 1.9–3.7)
Glucose, Bld: 100 mg/dL — ABNORMAL HIGH (ref 65–99)
Potassium: 4.3 mmol/L (ref 3.5–5.3)
Sodium: 139 mmol/L (ref 135–146)
Total Bilirubin: 0.5 mg/dL (ref 0.2–1.2)
Total Protein: 7.7 g/dL (ref 6.1–8.1)
eGFR: 98 mL/min/{1.73_m2} (ref >=60)

## 2023-08-30 LAB — HIV-1 RNA QUANT-NO REFLEX-BLD
HIV 1 RNA Quant: 20 {copies}/mL — ABNORMAL HIGH
HIV-1 RNA Quant, Log: 1.3 {Log} — ABNORMAL HIGH

## 2023-08-30 LAB — RPR: RPR Ser Ql: NONREACTIVE

## 2023-09-11 ENCOUNTER — Encounter: Payer: Self-pay | Admitting: Internal Medicine

## 2023-09-11 ENCOUNTER — Other Ambulatory Visit: Payer: Self-pay

## 2023-09-11 ENCOUNTER — Ambulatory Visit: Payer: Medicare HMO | Admitting: Internal Medicine

## 2023-09-11 VITALS — BP 131/84 | HR 56 | Temp 97.4°F | Ht 77.0 in | Wt 276.0 lb

## 2023-09-11 DIAGNOSIS — B20 Human immunodeficiency virus [HIV] disease: Secondary | ICD-10-CM

## 2023-09-11 DIAGNOSIS — Z23 Encounter for immunization: Secondary | ICD-10-CM

## 2023-09-11 DIAGNOSIS — Z113 Encounter for screening for infections with a predominantly sexual mode of transmission: Secondary | ICD-10-CM | POA: Diagnosis not present

## 2023-09-11 DIAGNOSIS — Z79899 Other long term (current) drug therapy: Secondary | ICD-10-CM

## 2023-09-11 DIAGNOSIS — Z5181 Encounter for therapeutic drug level monitoring: Secondary | ICD-10-CM | POA: Diagnosis not present

## 2023-09-11 MED ORDER — GENVOYA 150-150-200-10 MG PO TABS: 1.0000 | ORAL_TABLET | Freq: Every day | ORAL | 11 refills | Status: DC

## 2023-09-11 NOTE — Assessment & Plan Note (Signed)
 Screened negative

## 2023-09-11 NOTE — Assessment & Plan Note (Signed)
Lipid panel reviewed with him  

## 2023-09-11 NOTE — Assessment & Plan Note (Signed)
Creat and LFTs wnl

## 2023-09-11 NOTE — Assessment & Plan Note (Signed)
 Continues doing well on his medication and no changes indicated. Recent labs reviewed with him and no concerns.  I offered support regarding his appropriate concerns of medication coverage.  Otherwise he can return in 11 to 12 months.

## 2023-09-11 NOTE — Progress Notes (Signed)
   Subjective:    Patient ID: Justin Joyce, male    DOB: 03/24/1961, 63 y.o.   MRN: [id number]  HPI Justin Joyce is here for follow-up of HIV. Continues on Genvoya and denies any missed doses.  He has had no issues with getting or taking his medication.  He is appropriately reacting to recent political events and concern for medication coverage which does include some government funded assistance.  He is afraid of losing the ability to afford his medication.   Review of Systems  Constitutional:  Negative for fatigue.  Gastrointestinal:  Negative for diarrhea.  Skin:  Negative for rash.       Objective:   Physical Exam Eyes:     General: No scleral icterus. Pulmonary:     Effort: Pulmonary effort is normal.  Neurological:     Mental Status: He is alert.    SH; no tobacco       Assessment & Plan:

## 2023-10-21 DIAGNOSIS — H04123 Dry eye syndrome of bilateral lacrimal glands: Secondary | ICD-10-CM | POA: Diagnosis not present

## 2023-10-21 DIAGNOSIS — H2513 Age-related nuclear cataract, bilateral: Secondary | ICD-10-CM | POA: Diagnosis not present

## 2023-10-21 DIAGNOSIS — H401131 Primary open-angle glaucoma, bilateral, mild stage: Secondary | ICD-10-CM | POA: Diagnosis not present

## 2023-11-04 DIAGNOSIS — M5416 Radiculopathy, lumbar region: Secondary | ICD-10-CM | POA: Diagnosis not present

## 2023-11-14 DIAGNOSIS — R7303 Prediabetes: Secondary | ICD-10-CM | POA: Diagnosis not present

## 2023-11-14 DIAGNOSIS — Z21 Asymptomatic human immunodeficiency virus [HIV] infection status: Secondary | ICD-10-CM | POA: Diagnosis not present

## 2023-11-14 DIAGNOSIS — M519 Unspecified thoracic, thoracolumbar and lumbosacral intervertebral disc disorder: Secondary | ICD-10-CM | POA: Diagnosis not present

## 2023-11-14 DIAGNOSIS — Z1331 Encounter for screening for depression: Secondary | ICD-10-CM | POA: Diagnosis not present

## 2023-11-14 DIAGNOSIS — J449 Chronic obstructive pulmonary disease, unspecified: Secondary | ICD-10-CM | POA: Diagnosis not present

## 2023-11-14 DIAGNOSIS — I10 Essential (primary) hypertension: Secondary | ICD-10-CM | POA: Diagnosis not present

## 2023-11-14 DIAGNOSIS — Z23 Encounter for immunization: Secondary | ICD-10-CM | POA: Diagnosis not present

## 2023-11-14 DIAGNOSIS — R5383 Other fatigue: Secondary | ICD-10-CM | POA: Diagnosis not present

## 2023-11-14 DIAGNOSIS — E785 Hyperlipidemia, unspecified: Secondary | ICD-10-CM | POA: Diagnosis not present

## 2023-11-14 DIAGNOSIS — Z0000 Encounter for general adult medical examination without abnormal findings: Secondary | ICD-10-CM | POA: Diagnosis not present

## 2023-11-14 DIAGNOSIS — Z125 Encounter for screening for malignant neoplasm of prostate: Secondary | ICD-10-CM | POA: Diagnosis not present

## 2023-12-02 NOTE — Progress Notes (Signed)
 The ASCVD Risk score (Arnett DK, et al., 2019) failed to calculate for the following reasons:   The valid total cholesterol range is 130 to 320 mg/dL  Arlon Bergamo, BSN, RN

## 2024-01-22 ENCOUNTER — Telehealth: Payer: Self-pay

## 2024-01-22 NOTE — Telephone Encounter (Signed)
 Patient walked into clinic today requesting proof of HIV positivity for PAF co-ay relief program. States that he has submitted application and is approved, is missing proof of positivity to continue with program. Printed last viral load and faxed with letter as requested.  Provided patient with lab results in case he needs to resend anything.  F: [phone] Lorenda CHRISTELLA Code, RMA

## 2024-02-07 ENCOUNTER — Telehealth: Payer: Self-pay

## 2024-02-07 NOTE — Telephone Encounter (Signed)
 We have received a notification from [facility] requesting documentation for the patients diagnoses . We have sent the requested information as this is the second attempt to do so. Another attempt was made on 01/22/24 by CMA Lorenda Code. Patient did submit application on his own and we are sending this documentation on his behalf.

## 2024-05-05 DIAGNOSIS — M5416 Radiculopathy, lumbar region: Secondary | ICD-10-CM | POA: Diagnosis not present

## 2024-05-20 DIAGNOSIS — E785 Hyperlipidemia, unspecified: Secondary | ICD-10-CM | POA: Diagnosis not present

## 2024-05-20 DIAGNOSIS — R5383 Other fatigue: Secondary | ICD-10-CM | POA: Diagnosis not present

## 2024-05-20 DIAGNOSIS — R0683 Snoring: Secondary | ICD-10-CM | POA: Diagnosis not present

## 2024-05-20 DIAGNOSIS — R7303 Prediabetes: Secondary | ICD-10-CM | POA: Diagnosis not present

## 2024-05-20 DIAGNOSIS — F330 Major depressive disorder, recurrent, mild: Secondary | ICD-10-CM | POA: Diagnosis not present

## 2024-05-20 DIAGNOSIS — I10 Essential (primary) hypertension: Secondary | ICD-10-CM | POA: Diagnosis not present

## 2024-05-20 DIAGNOSIS — J449 Chronic obstructive pulmonary disease, unspecified: Secondary | ICD-10-CM | POA: Diagnosis not present

## 2024-05-20 DIAGNOSIS — G479 Sleep disorder, unspecified: Secondary | ICD-10-CM | POA: Diagnosis not present

## 2024-06-26 DIAGNOSIS — H401131 Primary open-angle glaucoma, bilateral, mild stage: Secondary | ICD-10-CM | POA: Diagnosis not present

## 2024-06-26 DIAGNOSIS — H43812 Vitreous degeneration, left eye: Secondary | ICD-10-CM | POA: Diagnosis not present

## 2024-06-26 DIAGNOSIS — H04123 Dry eye syndrome of bilateral lacrimal glands: Secondary | ICD-10-CM | POA: Diagnosis not present

## 2024-06-26 DIAGNOSIS — H2513 Age-related nuclear cataract, bilateral: Secondary | ICD-10-CM | POA: Diagnosis not present

## 2024-07-27 ENCOUNTER — Other Ambulatory Visit: Payer: Self-pay

## 2024-07-27 ENCOUNTER — Other Ambulatory Visit

## 2024-07-27 ENCOUNTER — Other Ambulatory Visit (HOSPITAL_COMMUNITY)
Admission: RE | Admit: 2024-07-27 | Discharge: 2024-07-27 | Disposition: A | Discharge status: Home / Self Care | Source: Ambulatory Visit | Attending: Infectious Disease | Admitting: Infectious Disease

## 2024-07-27 DIAGNOSIS — B20 Human immunodeficiency virus [HIV] disease: Secondary | ICD-10-CM

## 2024-07-27 DIAGNOSIS — Z113 Encounter for screening for infections with a predominantly sexual mode of transmission: Secondary | ICD-10-CM

## 2024-07-27 DIAGNOSIS — Z79899 Other long term (current) drug therapy: Secondary | ICD-10-CM

## 2024-07-28 ENCOUNTER — Encounter: Payer: Self-pay | Admitting: Infectious Disease

## 2024-07-28 DIAGNOSIS — E785 Hyperlipidemia, unspecified: Secondary | ICD-10-CM | POA: Insufficient documentation

## 2024-07-28 LAB — URINE CYTOLOGY ANCILLARY ONLY
Chlamydia: NEGATIVE
Comment: NEGATIVE
Comment: NORMAL
Neisseria Gonorrhea: NEGATIVE

## 2024-07-28 LAB — T-HELPER CELL (CD4) - (RCID CLINIC ONLY)
CD4 % Helper T Cell: 32 % — ABNORMAL LOW (ref 33–65)
CD4 T Cell Abs: 507 /uL (ref 400–1790)

## 2024-07-28 NOTE — Progress Notes (Signed)
 "  Subjective:  Chief complaint: follow-up for HIV disease on medications   Patient ID: Justin Joyce, male    DOB: 1961/01/11, 64 y.o.   MRN: [id number]  HPI  Discussed the use of AI scribe software for clinical note transcription with the patient, who gave verbal consent to proceed.  History of Present Illness   Justin Joyce is a 64 year old male with HIV who presents for routine follow-up.  He is currently taking Genvoya  for HIV management. His most recent viral load was undetectable and his CD4 count was 507. He has been on this medication for an extended period and reports not having side effects. He has a history of full-blown AIDS in 1997 but has remained stable with consistent medication adherence. He is proactive about his health, receiving annual flu and COVID vaccinations.  He is also on losartan 25 mg for blood pressure management, meloxicam  15 mg as needed for pain, trazodone  150 mg at bedtime, and gabapentin for pain radiating from his back down his leg. He is on rosuvastatin.       Past Medical History:  Diagnosis Date   COPD (chronic obstructive pulmonary disease) (HCC)    H/O hiatal hernia    HIV infection (HCC)    HIV positive (HCC)     Past Surgical History:  Procedure Laterality Date   COLONOSCOPY WITH PROPOFOL  N/A 03/24/2013   Procedure: COLONOSCOPY WITH PROPOFOL ;  Surgeon: Gladis MARLA Louder, MD;  Location: [facility];  Service: Endoscopy;  Laterality: N/A;   LAMINECTOMY AND MICRODISCECTOMY LUMBAR SPINE  March 2012    Family History  Problem Relation Age of Onset   Diabetes Father    Diabetes Sister       Social History   Socioeconomic History   Marital status: Divorced    Spouse name: Not on file   Number of children: Not on file   Years of education: Not on file   Highest education level: Not on file  Occupational History   Not on file  Tobacco Use   Smoking status: Former    Current packs/day: 0.25    Average packs/day: 0.3 packs/day for  30.0 years (7.5 ttl pk-yrs)    Types: Cigarettes   Smokeless tobacco: Never  Substance and Sexual Activity   Alcohol use: Yes    Alcohol/week: 0.0 standard drinks of alcohol    Comment: occassionally   Drug use: No   Sexual activity: Not Currently    Partners: Male    Comment: declined  condoms  Other Topics Concern   Not on file  Social History Narrative   Not on file   Social Drivers of Health   Tobacco Use: Medium Risk (09/11/2023)   Patient History    Smoking Tobacco Use: Former    Smokeless Tobacco Use: Never    Passive Exposure: Not on Actuary Strain: Not on file  Food Insecurity: Not on file  Transportation Needs: Not on file  Physical Activity: Not on file  Stress: Not on file  Social Connections: Not on file  Depression (PHQ2-9): Low Risk (09/11/2023)   Depression (PHQ2-9)    PHQ-2 Score: 1  Alcohol Screen: Not on file  Housing: Not on file  Utilities: Not on file  Health Literacy: Not on file    Allergies[1]  Current Medications[2]   Review of Systems  Constitutional:  Negative for activity change, appetite change, chills, diaphoresis, fatigue, fever and unexpected weight change.  HENT:  Negative for congestion, rhinorrhea,  sinus pressure, sneezing, sore throat and trouble swallowing.   Eyes:  Negative for photophobia and visual disturbance.  Respiratory:  Negative for cough, chest tightness, shortness of breath, wheezing and stridor.   Cardiovascular:  Negative for chest pain, palpitations and leg swelling.  Gastrointestinal:  Negative for abdominal distention, abdominal pain, anal bleeding, blood in stool, constipation, diarrhea, nausea and vomiting.  Genitourinary:  Negative for difficulty urinating, dysuria, flank pain and hematuria.  Musculoskeletal:  Negative for arthralgias, back pain, gait problem, joint swelling and myalgias.  Skin:  Negative for color change, pallor, rash and wound.  Neurological:  Negative for dizziness,  tremors, weakness and light-headedness.  Hematological:  Negative for adenopathy. Does not bruise/bleed easily.  Psychiatric/Behavioral:  Negative for agitation, behavioral problems, confusion, decreased concentration, dysphoric mood and sleep disturbance.        Objective:   Physical Exam Constitutional:      Appearance: He is well-developed.  HENT:     Head: Normocephalic and atraumatic.  Eyes:     Conjunctiva/sclera: Conjunctivae normal.  Cardiovascular:     Rate and Rhythm: Normal rate and regular rhythm.  Pulmonary:     Effort: Pulmonary effort is normal. No respiratory distress.     Breath sounds: No wheezing.  Abdominal:     General: There is no distension.     Palpations: Abdomen is soft.  Musculoskeletal:        General: No tenderness. Normal range of motion.     Cervical back: Normal range of motion and neck supple.  Skin:    General: Skin is warm and dry.     Coloration: Skin is not pale.     Findings: No erythema or rash.  Neurological:     General: No focal deficit present.     Mental Status: He is alert and oriented to person, place, and time.  Psychiatric:        Mood and Affect: Mood normal.        Behavior: Behavior normal.        Thought Content: Thought content normal.        Judgment: Judgment normal.           Assessment & Plan:   Assessment and Plan    Human immunodeficiency virus (HIV) disease HIV well-controlled with Genvoya . Viral load undetectable, CD4 count healthy. Discussed potential switch to boosterless regimen due to cobicistat interaction with rosuvastatin and other drugs down the road he may need. Both Biktarvy and Dovato are safe and effective options. - Continue Genvoya  for HIV management. - Consider switching to [facility] or Dovato for a boosterless regimen. - Schedule follow-up in 10 months for routine monitoring.  Hyperlipidemia Managed with rosuvastatin 20 mg. Statins reduce cardiovascular risk, especially in HIV.  Cobicistat may elevate rosuvastatin levels. - Continue rosuvastatin 20 mg daily.  Vaccine counseling: up to date on vaccines      HTN: continue losartan    [1]  Allergies Allergen Reactions   Sulfa Antibiotics Hives  [2]  Current Outpatient Medications:    elvitegravir-cobicistat-emtricitabine -tenofovir  (GENVOYA ) 150-150-200-10 MG TABS tablet, Take 1 tablet by mouth daily. with food, Disp: 30 tablet, Rfl: 11   HYDROcodone-acetaminophen (NORCO/VICODIN) 5-325 MG tablet, , Disp: , Rfl:    losartan (COZAAR) 25 MG tablet, Take 25 mg by mouth daily., Disp: , Rfl:    meloxicam  (MOBIC ) 15 MG tablet, TAKE 1 TABLET(15 MG) BY MOUTH DAILY AS NEEDED FOR PAIN, Disp: 30 tablet, Rfl: 3   traZODone  (DESYREL ) 100 MG tablet, Take 150  mg by mouth at bedtime. Takes 1 and 1/2, Disp: , Rfl:   "

## 2024-07-29 LAB — COMPLETE METABOLIC PANEL WITHOUT GFR
AG Ratio: 1.3 (calc) (ref 1.0–2.5)
ALT: 19 U/L (ref 9–46)
AST: 22 U/L (ref 10–35)
Albumin: 4.4 g/dL (ref 3.6–5.1)
Alkaline phosphatase (APISO): 66 U/L (ref 35–144)
BUN: 10 mg/dL (ref 7–25)
CO2: 28 mmol/L (ref 20–32)
Calcium: 9.3 mg/dL (ref 8.6–10.3)
Chloride: 104 mmol/L (ref 98–110)
Creat: 0.91 mg/dL (ref 0.70–1.35)
Globulin: 3.5 g/dL (ref 1.9–3.7)
Glucose, Bld: 70 mg/dL (ref 65–99)
Potassium: 4.5 mmol/L (ref 3.5–5.3)
Sodium: 141 mmol/L (ref 135–146)
Total Bilirubin: 0.4 mg/dL (ref 0.2–1.2)
Total Protein: 7.9 g/dL (ref 6.1–8.1)

## 2024-07-29 LAB — CBC WITH DIFFERENTIAL/PLATELET
Absolute Lymphocytes: 1782 {cells}/uL (ref 850–3900)
Absolute Monocytes: 576 {cells}/uL (ref 200–950)
Basophils Absolute: 27 {cells}/uL (ref 0–200)
Basophils Relative: 0.4 %
Eosinophils Absolute: 60 {cells}/uL (ref 15–500)
Eosinophils Relative: 0.9 %
HCT: 43.6 % (ref 39.4–51.1)
Hemoglobin: 14.2 g/dL (ref 13.2–17.1)
MCH: 29.6 pg (ref 27.0–33.0)
MCHC: 32.6 g/dL (ref 31.6–35.4)
MCV: 91 fL (ref 81.4–101.7)
MPV: 10.5 fL (ref 7.5–12.5)
Monocytes Relative: 8.6 %
Neutro Abs: 4255 {cells}/uL (ref 1500–7800)
Neutrophils Relative %: 63.5 %
Platelets: 231 Thousand/uL (ref 140–400)
RBC: 4.79 Million/uL (ref 4.20–5.80)
RDW: 13.8 % (ref 11.0–15.0)
Total Lymphocyte: 26.6 %
WBC: 6.7 Thousand/uL (ref 3.8–10.8)

## 2024-07-29 LAB — HIV-1 RNA QUANT-NO REFLEX-BLD
HIV 1 RNA Quant: NOT DETECTED {copies}/mL
HIV-1 RNA Quant, Log: NOT DETECTED {Log_copies}/mL

## 2024-07-29 LAB — SYPHILIS: RPR W/REFLEX TO RPR TITER AND TREPONEMAL ANTIBODIES, TRADITIONAL SCREENING AND DIAGNOSIS ALGORITHM: RPR Ser Ql: NONREACTIVE

## 2024-07-30 ENCOUNTER — Ambulatory Visit: Admitting: Infectious Disease

## 2024-07-30 ENCOUNTER — Encounter: Payer: Self-pay | Admitting: Infectious Disease

## 2024-07-30 ENCOUNTER — Other Ambulatory Visit: Payer: Self-pay

## 2024-07-30 VITALS — BP 137/83 | HR 78 | Temp 97.9°F | Ht 77.0 in | Wt 275.0 lb

## 2024-07-30 DIAGNOSIS — B20 Human immunodeficiency virus [HIV] disease: Secondary | ICD-10-CM

## 2024-07-30 DIAGNOSIS — I10 Essential (primary) hypertension: Secondary | ICD-10-CM | POA: Insufficient documentation

## 2024-07-30 DIAGNOSIS — E785 Hyperlipidemia, unspecified: Secondary | ICD-10-CM | POA: Diagnosis not present

## 2024-07-30 MED ORDER — GENVOYA 150-150-200-10 MG PO TABS: 1.0000 | ORAL_TABLET | Freq: Every day | ORAL | 11 refills | Status: AC

## 2024-08-10 ENCOUNTER — Ambulatory Visit: Payer: Self-pay | Admitting: Infectious Disease

## 2025-06-01 ENCOUNTER — Ambulatory Visit: Payer: Self-pay | Admitting: Infectious Disease
